# Patient Record
Sex: Female | Born: 1965 | Race: Black or African American | Hispanic: No | Marital: Single | Smoking: Never smoker
Health system: Southern US, Community
[De-identification: ages and names within clinical notes are randomized; demographics above are authoritative.]

## PROBLEM LIST (undated history)

## (undated) DIAGNOSIS — G629 Polyneuropathy, unspecified: Secondary | ICD-10-CM

## (undated) DIAGNOSIS — E119 Type 2 diabetes mellitus without complications: Secondary | ICD-10-CM

## (undated) DIAGNOSIS — J45909 Unspecified asthma, uncomplicated: Secondary | ICD-10-CM

## (undated) DIAGNOSIS — I10 Essential (primary) hypertension: Secondary | ICD-10-CM

## (undated) HISTORY — PX: ABDOMINAL HYSTERECTOMY: SHX81

---

## 2003-10-25 ENCOUNTER — Other Ambulatory Visit: Payer: Self-pay

## 2004-06-20 ENCOUNTER — Emergency Department: Payer: Self-pay | Admitting: Emergency Medicine

## 2005-02-09 ENCOUNTER — Emergency Department: Payer: Self-pay | Admitting: Emergency Medicine

## 2005-12-01 ENCOUNTER — Other Ambulatory Visit: Payer: Self-pay

## 2005-12-01 ENCOUNTER — Inpatient Hospital Stay: Payer: Self-pay | Admitting: Internal Medicine

## 2007-08-05 ENCOUNTER — Emergency Department: Payer: Self-pay | Admitting: Emergency Medicine

## 2012-02-01 ENCOUNTER — Emergency Department: Payer: Self-pay | Admitting: *Deleted

## 2012-10-07 ENCOUNTER — Ambulatory Visit: Payer: Self-pay | Admitting: Family Medicine

## 2012-10-19 ENCOUNTER — Ambulatory Visit: Payer: Self-pay | Admitting: Family Medicine

## 2013-07-16 IMAGING — MG MM CAD SCREENING MAMMO
1 series · 6 of 6 positions shown · non-contrast
Comparison: none

REASON FOR EXAM: SCR MAMMO NO ORDER BASELINE PINK RIBBON
COMMENTS:

[R CC · right · 6 of 6 slices shown]
[im 1/6]
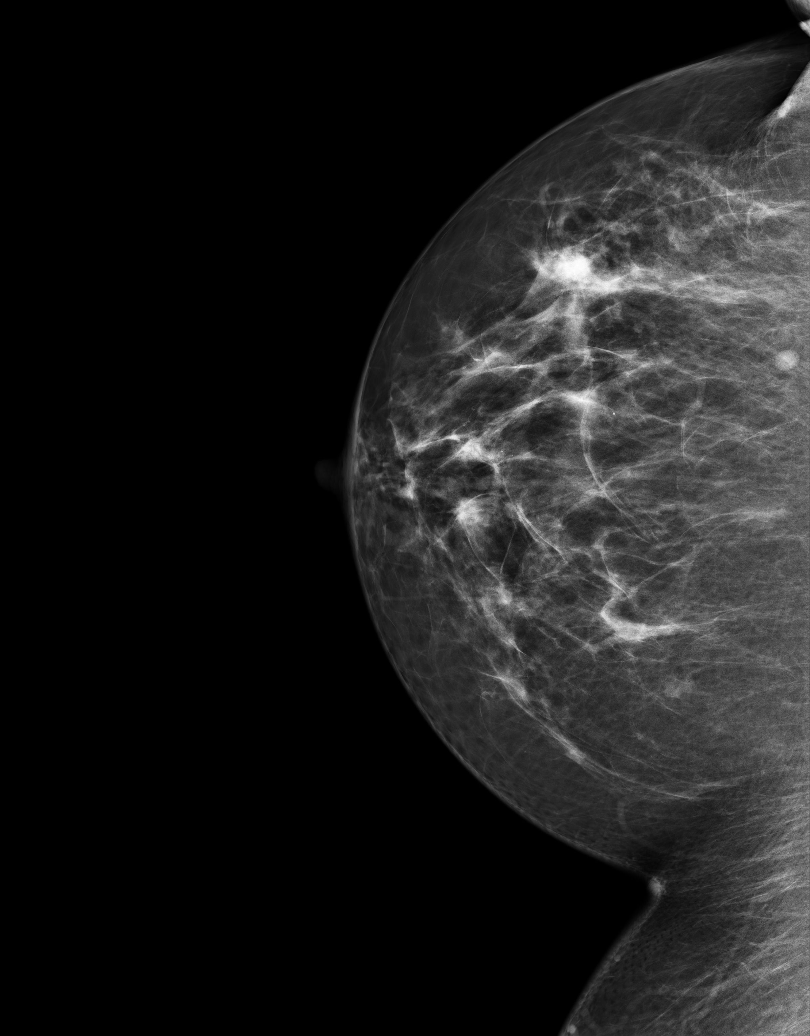
[im 2/6]
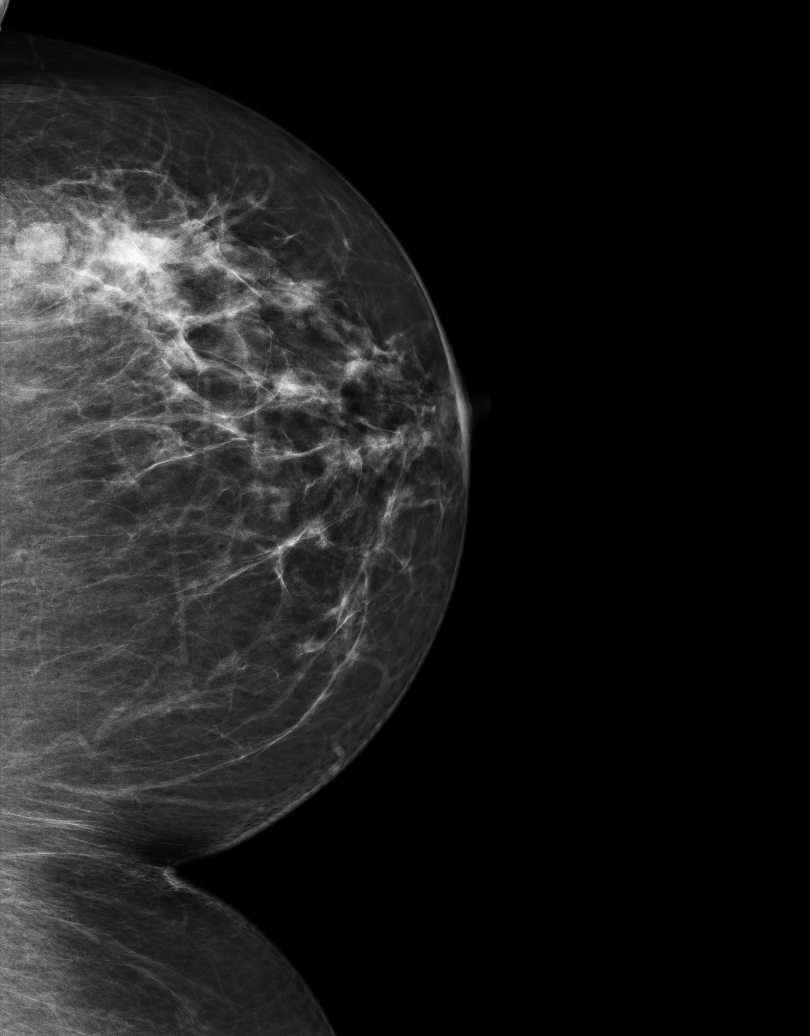
[im 3/6]
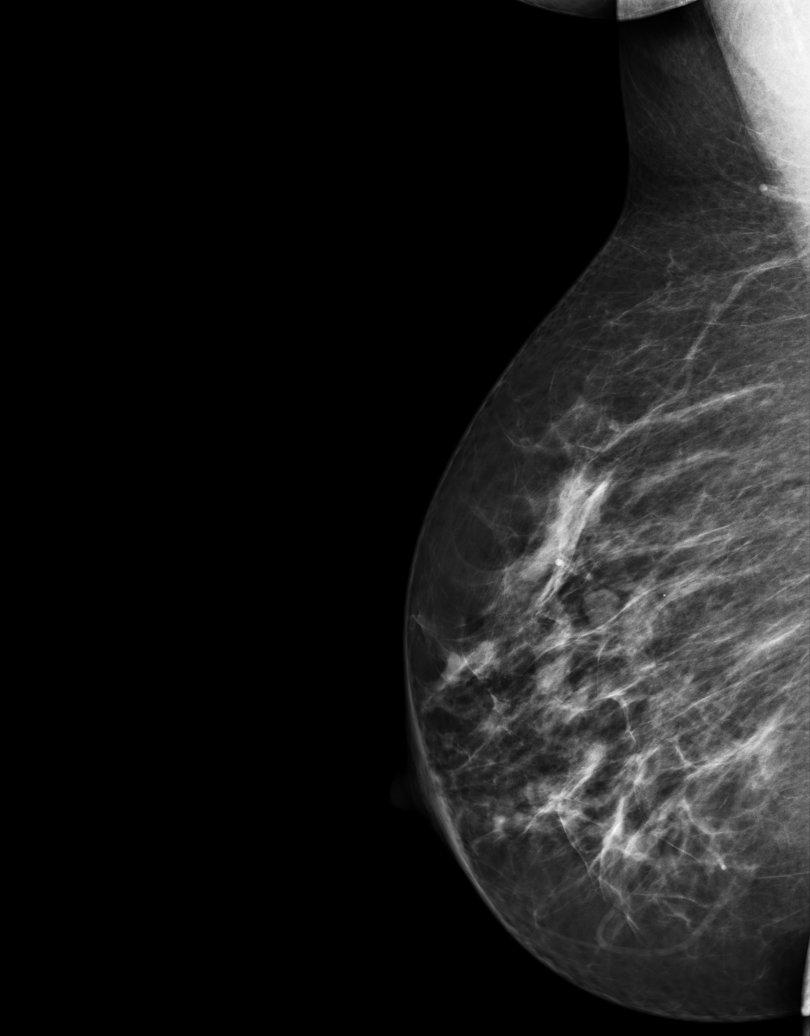
[im 4/6]
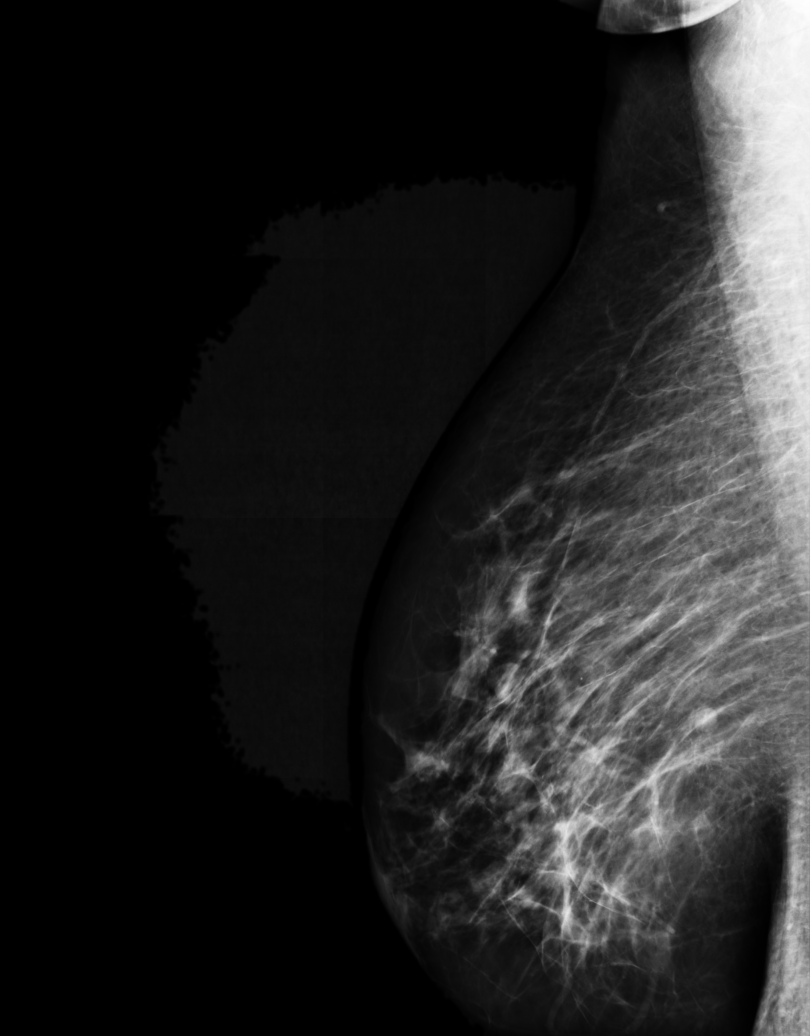
[im 5/6]
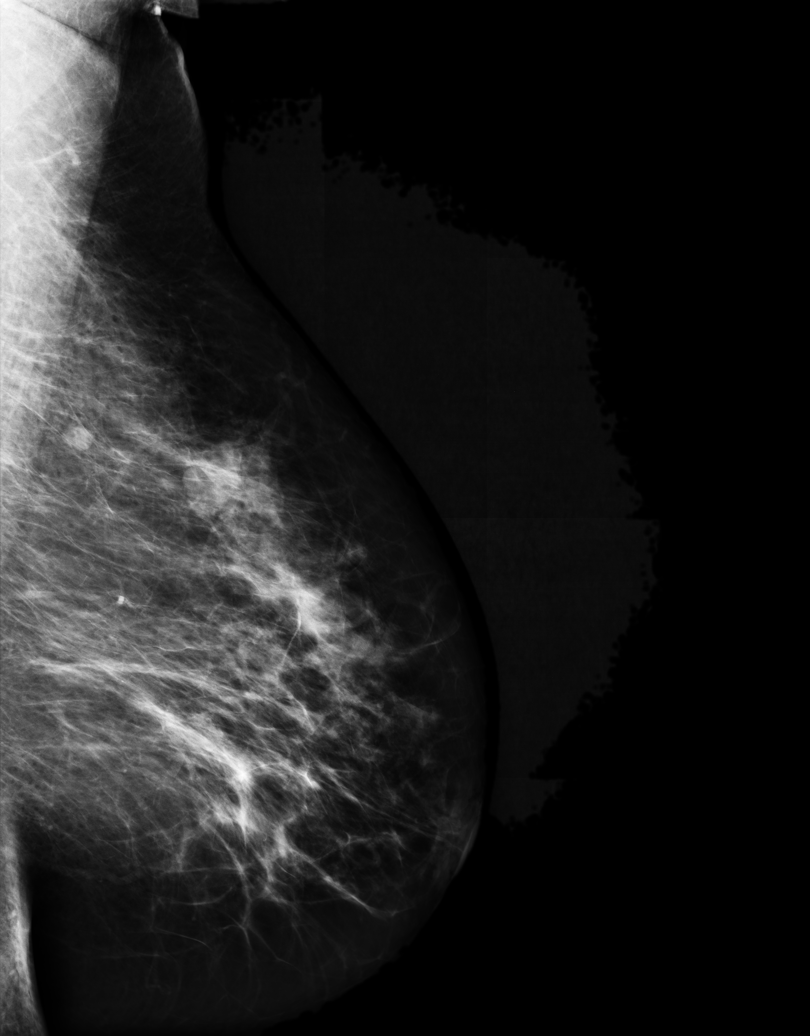
[im 6/6]
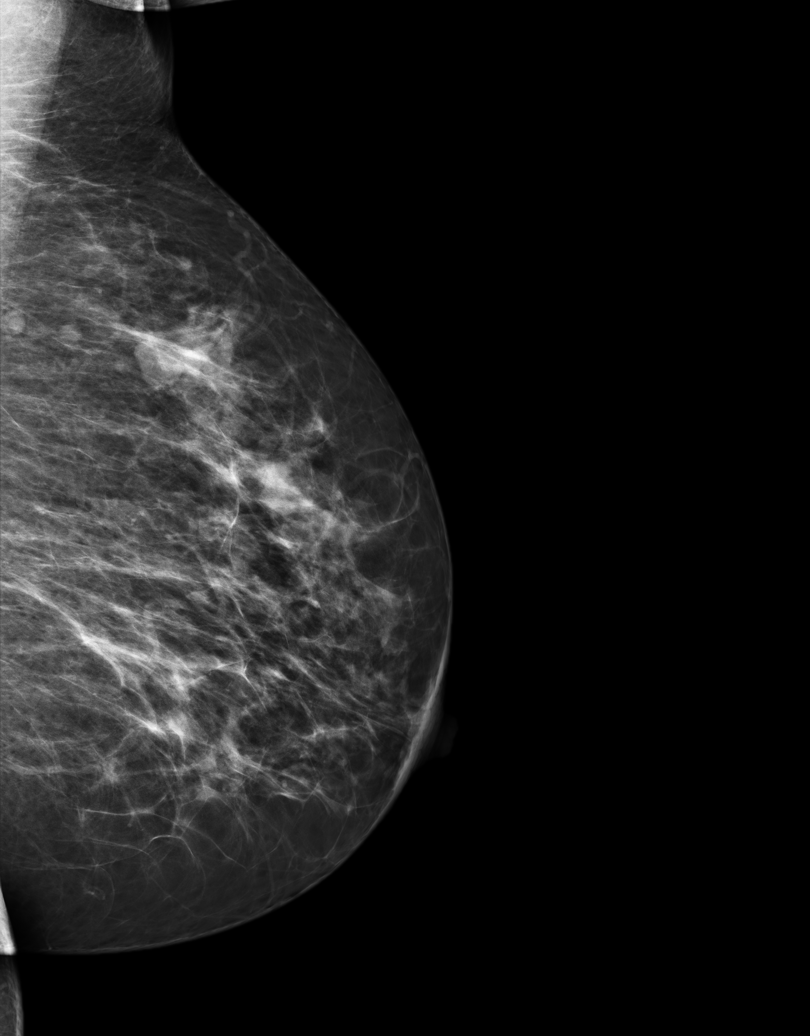

[6 of 6 positions shown; findings below may reference images not displayed]

PROCEDURE:     MAM - MAM DGTL SCRN MAM NO ORDER W/CAD  - October 07, 2012  [DATE]

RESULT:     No prior studies available for comparison. Two nodular densities
are noted in the upper outer portions of both breasts. Patient should return
for bilateral compression spot studies and if need be ultrasound. Benign
calcification. CAD evaluation otherwise nonfocal.
IMPRESSION: Bilateral nodular densities in the upperouter portions of
both breasts. Magnification spot studies if need be ultrasound suggested for
further evaluation. BI-RADS: Catagory 0 - Need Additional Imaging Evaluation

A NEGATIVE MAMMOGRAM REPORT DOES NOT PRECLUDE BIOPSY OR OTHER EVALUATION OF
A CLINICALLY PALPABLE OR OTHERWISE SUSPICIOUS MASS OR LESION. BREAST CANCER
MAY NOT BE DETECTED IN UP TO 10% OF CASES.

BREAST COMPOSITION: The breast composition is SCATTERED FIBROGLANDULAR
TISSUE (glandular tissue is 25-50%)

## 2013-08-23 ENCOUNTER — Ambulatory Visit: Payer: Self-pay | Admitting: Family Medicine

## 2014-03-02 ENCOUNTER — Ambulatory Visit: Payer: Self-pay | Admitting: Family Medicine

## 2014-09-20 DIAGNOSIS — E782 Mixed hyperlipidemia: Secondary | ICD-10-CM | POA: Insufficient documentation

## 2014-09-20 DIAGNOSIS — R079 Chest pain, unspecified: Secondary | ICD-10-CM | POA: Insufficient documentation

## 2014-09-20 DIAGNOSIS — E119 Type 2 diabetes mellitus without complications: Secondary | ICD-10-CM | POA: Insufficient documentation

## 2014-09-20 DIAGNOSIS — E1140 Type 2 diabetes mellitus with diabetic neuropathy, unspecified: Secondary | ICD-10-CM | POA: Insufficient documentation

## 2014-09-20 DIAGNOSIS — J45909 Unspecified asthma, uncomplicated: Secondary | ICD-10-CM | POA: Insufficient documentation

## 2014-09-20 DIAGNOSIS — R0602 Shortness of breath: Secondary | ICD-10-CM | POA: Insufficient documentation

## 2014-09-20 DIAGNOSIS — F411 Generalized anxiety disorder: Secondary | ICD-10-CM | POA: Insufficient documentation

## 2014-09-20 DIAGNOSIS — F418 Other specified anxiety disorders: Secondary | ICD-10-CM | POA: Insufficient documentation

## 2015-04-03 DIAGNOSIS — I10 Essential (primary) hypertension: Secondary | ICD-10-CM | POA: Insufficient documentation

## 2017-10-04 ENCOUNTER — Other Ambulatory Visit: Payer: Self-pay

## 2017-10-04 ENCOUNTER — Encounter: Payer: Self-pay | Admitting: Emergency Medicine

## 2017-10-04 ENCOUNTER — Emergency Department
Admission: EM | Admit: 2017-10-04 | Discharge: 2017-10-05 | Disposition: A | Discharge status: Home / Self Care | Payer: Self-pay | Attending: Emergency Medicine | Admitting: Emergency Medicine

## 2017-10-04 ENCOUNTER — Emergency Department: Payer: Self-pay

## 2017-10-04 DIAGNOSIS — E119 Type 2 diabetes mellitus without complications: Secondary | ICD-10-CM | POA: Insufficient documentation

## 2017-10-04 DIAGNOSIS — K5900 Constipation, unspecified: Secondary | ICD-10-CM | POA: Insufficient documentation

## 2017-10-04 DIAGNOSIS — J45909 Unspecified asthma, uncomplicated: Secondary | ICD-10-CM | POA: Insufficient documentation

## 2017-10-04 DIAGNOSIS — R109 Unspecified abdominal pain: Secondary | ICD-10-CM

## 2017-10-04 DIAGNOSIS — K5730 Diverticulosis of large intestine without perforation or abscess without bleeding: Secondary | ICD-10-CM | POA: Insufficient documentation

## 2017-10-04 DIAGNOSIS — I10 Essential (primary) hypertension: Secondary | ICD-10-CM | POA: Insufficient documentation

## 2017-10-04 HISTORY — DX: Essential (primary) hypertension: I10

## 2017-10-04 HISTORY — DX: Type 2 diabetes mellitus without complications: E11.9

## 2017-10-04 HISTORY — DX: Unspecified asthma, uncomplicated: J45.909

## 2017-10-04 LAB — COMPREHENSIVE METABOLIC PANEL
ALT: 28 U/L (ref 14–54)
AST: 26 U/L (ref 15–41)
Albumin: 4 g/dL (ref 3.5–5.0)
Alkaline Phosphatase: 65 U/L (ref 38–126)
Anion gap: 10 (ref 5–15)
BUN: 13 mg/dL (ref 6–20)
CALCIUM: 9.1 mg/dL (ref 8.9–10.3)
CHLORIDE: 102 mmol/L (ref 101–111)
CO2: 25 mmol/L (ref 22–32)
CREATININE: 0.88 mg/dL (ref 0.44–1.00)
GFR calc Af Amer: >60 mL/min (ref >60)
GFR calc non Af Amer: >60 mL/min (ref >60)
Glucose, Bld: 263 mg/dL — ABNORMAL HIGH (ref 65–99)
Potassium: 4.4 mmol/L (ref 3.5–5.1)
SODIUM: 137 mmol/L (ref 135–145)
Total Bilirubin: 0.5 mg/dL (ref 0.3–1.2)
Total Protein: 7.6 g/dL (ref 6.5–8.1)

## 2017-10-04 LAB — LIPASE, BLOOD: LIPASE: 35 U/L (ref 11–51)

## 2017-10-04 LAB — CBC
HCT: 38.1 % (ref 35.0–47.0)
Hemoglobin: 12.7 g/dL (ref 12.0–16.0)
MCH: 27.8 pg (ref 26.0–34.0)
MCHC: 33.3 g/dL (ref 32.0–36.0)
MCV: 83.4 fL (ref 80.0–100.0)
PLATELETS: 327 10*3/uL (ref 150–440)
RBC: 4.56 MIL/uL (ref 3.80–5.20)
RDW: 14.5 % (ref 11.5–14.5)
WBC: 8.8 10*3/uL (ref 3.6–11.0)

## 2017-10-04 LAB — DIFFERENTIAL
Basophils Absolute: 0.1 10*3/uL (ref 0–0.1)
Basophils Relative: 1 %
EOS ABS: 0.2 10*3/uL (ref 0–0.7)
EOS PCT: 2 %
Lymphocytes Relative: 44 %
Lymphs Abs: 3.9 10*3/uL — ABNORMAL HIGH (ref 1.0–3.6)
MONO ABS: 0.5 10*3/uL (ref 0.2–0.9)
Monocytes Relative: 5 %
Neutro Abs: 4.2 10*3/uL (ref 1.4–6.5)
Neutrophils Relative %: 48 %

## 2017-10-04 LAB — TSH: TSH: 1.093 u[IU]/mL (ref 0.350–4.500)

## 2017-10-04 MED ORDER — IOPAMIDOL (ISOVUE-300) INJECTION 61%
30.0000 mL | Freq: Once | INTRAVENOUS | Status: AC | PRN
  Administered 2017-10-04: 30 mL via ORAL

## 2017-10-04 MED ORDER — IOPAMIDOL (ISOVUE-300) INJECTION 61%
125.0000 mL | Freq: Once | INTRAVENOUS | Status: AC | PRN
  Administered 2017-10-04: 125 mL via INTRAVENOUS

## 2017-10-04 NOTE — ED Triage Notes (Addendum)
Pt reports constipation for 2 months; c/o epigastric pain and lower abd pain; pt unable to state last time had a normal bowel movement; she said only passing small amount of loose stool;

## 2017-10-04 NOTE — ED Notes (Signed)
Pt reports that she has not had a normal BM in 2 months.  Pt states that she has also had issues with her blood sugar being high and that is approximately when all of this began.  Pt is A&Ox4, in NAD.

## 2017-10-04 NOTE — ED Provider Notes (Addendum)
[facility] Emergency Department Provider Note   ____________________________________________   First MD Initiated Contact with Patient 10/04/17 2247     (approximate)  I have reviewed the triage vital signs and the nursing notes.   HISTORY  Chief Complaint Abdominal Pain    HPI Kristen Ibarra is a 52 y.o. female who reports she's been unable have a normal bowel movement for about 2 months she is only passing small amounts of loose stools or or she says "jelly". She says her belly is achy and getting bigger and she is gaining weight. Her sugars have also begun to go up and has stayed up since this started. She does not have any vomiting. She is not rhaving any fever. the abdominal pain is mostly achy but sometimes crampy.   Past Medical History:  Diagnosis Date  . Asthma   . Diabetes mellitus without complication ([place])   . Hypertension     There are no active problems to display for this patient.   History reviewed. No pertinent surgical history.  Prior to Admission medications   Not on File    Allergies Patient has no known allergies.  History reviewed. No pertinent family history.  Social History Social History   Tobacco Use  . Smoking status: Not on file  Substance Use Topics  . Alcohol use: Not on file  . Drug use: Not on file    Review of Systems  Constitutional: No fever/chills Eyes: No visual changes. ENT: No sore throat. Cardiovascular: Denies chest pain. Respiratory: Denies shortness of breath. Gastrointestinal:abdominal pain.  No nausea, no vomiting.  No diarrhea. constipation. Genitourinary: Negative for dysuria. Musculoskeletal: Negative for back pain. Skin: Negative for rash. Neurological: Negative for headaches, focal weakness or _______________   PHYSICAL EXAM:  VITAL SIGNS: ED Triage Vitals  Enc Vitals Group     BP 10/04/17 2216 (!) 199/123     Pulse Rate 10/04/17 2216 85     Resp 10/04/17 2216 19       Temp 10/04/17 2216 99.5 F (37.5 C)     Temp Source 10/04/17 2216 Oral     SpO2 10/04/17 2216 96 %     Weight 10/04/17 2228 270 lb (122.5 kg)     Height 10/04/17 2228 5' 2.5" (1.588 m)     Head Circumference --      Peak Flow --      Pain Score 10/04/17 2217 8     Pain Loc --      Pain Edu? --      Excl. in [place]? --     Constitutional: Alert and oriented. Well appearing and in no acute distress. Eyes: Conjunctivae are normal.  Head: Atraumatic. Nose: No congestion/rhinnorhea. Mouth/Throat: Mucous membranes are moist.  Oropharynx non-erythematous. Neck: No stridor. Cardiovascular: Normal rate, regular rhythm. Grossly normal heart sounds.  Good peripheral circulation. Respiratory: Normal respiratory effort.  No retractions. Lungs CTAB. Gastrointestinal: Soft diffusely mildly tender to palpation. No distention. No abdominal bruits. No CVA tenderness. Musculoskeletal: No lower extremity tenderness nor edema.  No joint effusions. rectal: No masses very little stool in the vault with there is Hemoccult negative Neurologic:  Normal speech and language. No gross focal neurologic deficits are appreciated. . Skin:  Skin is warm, dry and intact. No rash noted. Psychiatric: Mood and affect are normal. Speech and behavior are normal.  ____________________________________________   LABS (all labs ordered are listed, but only abnormal results are displayed)  Labs Reviewed  COMPREHENSIVE METABOLIC PANEL -  Abnormal; Notable for the following components:      Result Value   Glucose, Bld 263 (*)    All other components within normal limits  DIFFERENTIAL - Abnormal; Notable for the following components:   Lymphs Abs 3.9 (*)    All other components within normal limits  LIPASE, BLOOD  CBC  TSH  URINALYSIS, COMPLETE (UACMP) WITH MICROSCOPIC  POC URINE PREG, ED   ____________________________________________  EKG  EKG read and interpreted by me shows normal sinus rhythm rate of 84  normal axis nonspecific ST-T wave changes are present ____________________________________________  RADIOLOGY  ED MD interpretation:    Official radiology report(s): No results found.  ____________________________________________   PROCEDURES  Procedure(s) performed:   Procedures  Critical Care performed:   ____________________________________________   INITIAL IMPRESSION / ASSESSMENT AND PLAN / ED COURSE  patient signed out to Dr. Owens Shark pending results         ____________________________________________   FINAL CLINICAL IMPRESSION(S) / ED DIAGNOSES  Final diagnoses:  Abdominal pain, unspecified abdominal location     ED Discharge Orders    None       Note:  This document was prepared using Dragon voice recognition software and may include unintentional dictation errors.    Nena Polio, MD 10/05/17 Ofilia Neas    Nena Polio, MD 10/20/17 [phone]

## 2017-10-05 ENCOUNTER — Telehealth: Payer: Self-pay | Admitting: Emergency Medicine

## 2017-10-05 LAB — URINALYSIS, COMPLETE (UACMP) WITH MICROSCOPIC
Bilirubin Urine: NEGATIVE
Glucose, UA: >=500 mg/dL — AB
Hgb urine dipstick: NEGATIVE
KETONES UR: NEGATIVE mg/dL
NITRITE: NEGATIVE
PH: 5 (ref 5.0–8.0)
Protein, ur: NEGATIVE mg/dL
SQUAMOUS EPITHELIAL / LPF: NONE SEEN
Specific Gravity, Urine: 1.015 (ref 1.005–1.030)

## 2017-10-05 MED ORDER — DOCUSATE SODIUM 50 MG/5ML PO LIQD
100.0000 mg | Freq: Once | ORAL | Status: AC
  Administered 2017-10-05: 100 mg via ORAL
  Filled 2017-10-05: qty 10

## 2017-10-05 MED ORDER — MAGNESIUM CITRATE PO SOLN
1.0000 | Freq: Once | ORAL | Status: AC
  Administered 2017-10-05: 1 via ORAL
  Filled 2017-10-05: qty 296

## 2017-10-05 MED ORDER — DOCUSATE SODIUM 100 MG PO CAPS
ORAL_CAPSULE | ORAL | Status: AC
  Administered 2017-10-05: 100 mg
  Filled 2017-10-05: qty 1

## 2017-10-05 MED ORDER — MINERAL OIL RE ENEM
1.0000 | ENEMA | Freq: Once | RECTAL | Status: AC
  Administered 2017-10-05: 1 via RECTAL

## 2017-10-05 MED ORDER — POLYETHYLENE GLYCOL 3350 17 G PO PACK: 17.0000 g | PACK | Freq: Every day | ORAL | 0 refills | Status: DC | PRN

## 2017-10-05 NOTE — ED Provider Notes (Signed)
I assumed care of the patient from Dr. Rip Harbour with recommendation to follow-up on CT scan of the abdomen and pelvis.  CT abdomen pelvis revealed diverticulosis and moderate stool burden.  Patient given an enema in the emergency department with good results.  Patient will be prescribed MiraLAX for home.  Patient informed of all clinical findings   Gregor Hams, MD 10/05/17 [phone]

## 2017-10-05 NOTE — Telephone Encounter (Addendum)
ua addendum note reviewed by dr Cinda Quest.  Attempted to call patient to assure she follows up with pcp.  Had budding yeast in urine specimen done yesterday.  Wanted to ask is any urinary symptoms as well.  Her phone is not taking calls at this time.  3/26--phone still says unavailable.  Called pcp office to give result to them.  They said patient is there now.

## 2017-10-21 ENCOUNTER — Ambulatory Visit: Payer: Self-pay | Admitting: Gastroenterology

## 2017-10-21 ENCOUNTER — Encounter: Payer: Self-pay | Admitting: Gastroenterology

## 2017-10-21 VITALS — BP 103/66 | HR 91 | Temp 98.1°F | Ht 62.0 in | Wt 255.8 lb

## 2017-10-21 DIAGNOSIS — R1013 Epigastric pain: Secondary | ICD-10-CM

## 2017-10-21 DIAGNOSIS — K5900 Constipation, unspecified: Secondary | ICD-10-CM

## 2017-10-21 MED ORDER — OMEPRAZOLE 40 MG PO CPDR: 40.0000 mg | DELAYED_RELEASE_CAPSULE | Freq: Every day | ORAL | 3 refills | Status: DC

## 2017-10-21 MED ORDER — PEG 3350-KCL-NA BICARB-NACL 420 G PO SOLR: 4000.0000 mL | Freq: Once | ORAL | 0 refills | Status: AC

## 2017-10-21 NOTE — Progress Notes (Signed)
[name] MD, MRCP(U.K) [address]  [place]  [place], [place] [address]  Main: [phone]  Fax: [phone]   Gastroenterology Consultation  Referring Provider:     Marguerita Merles, MD Primary Care Physician:  Marguerita Merles, MD Primary Gastroenterologist:  Dr. [name]  Reason for Consultation:     Constiption         HPI:   Kristen Ibarra is a 52 y.o. y/o female referred for consultation & management  by Dr. Lennox Grumbles, Connye Burkitt, MD.    She has been referred for constipation. She was seen at [facility] er on 10/11/17 for constipation, UTI. She presented with a 2 month history of abdominal pain and constipation. CT abdomen 10/05/17 showed no acute processed but diverticulosis of the left side of her colon , mild to moderate volume of stool in the colon . Labs 09/2017 CBC,LFT,TSH-normal   Per patient she says that she has had abdominal pain on and off for 2 months  Abdominal pain: Onset: 2 months - every few days , each episode lasts for hours  Site :all different spots , makes it hard to lay down  Radiation: all over the abdomen  Severity :today is not severe 4/10 - in the past can go to 8/10  Nature of pain: sharp in nature , pressure  Aggravating factors: nothing  Relieving factors :lay still  Weight loss: lost 15 lbs - unintentionally  NSAID use: Ibuprofen once a week  PPI use :none  Gall bladder surgery: intact  Frequency of bowel movements: once a week , lately last few months not going , tried miralax didn't work  Change in bowel movements: yes  Relief with bowel movements: yes  Gas/Bloating/Abdominal distension: lots    No diet sodas, chews occasional gum , no teas .   Never had a colonoscopy , never had an EGD. No family history of colon cancer or polyps .   No other GI symptoms  Past Medical History:  Diagnosis Date  . Asthma   . Diabetes mellitus without complication ([place])   . Hypertension     No past surgical history on file.  Prior to Admission  medications   Medication Sig Start Date End Date Taking? Authorizing Provider  senna-docusate (SENOKOT-S) 8.6-50 MG tablet Take by mouth. 10/11/17 10/26/17 Yes [provider]  albuterol (PROVENTIL) (2.5 MG/3ML) 0.083% nebulizer solution Inhale into the lungs.    [provider]  fluticasone (FLONASE) 50 MCG/ACT nasal spray Place into the nose.    [provider]  GABAPENTIN PO Take by mouth.    [provider]  insulin detemir (LEVEMIR) 100 UNIT/ML injection Inject into the skin.    [provider]  Insulin Glargine (LANTUS SOLOSTAR) 100 UNIT/ML Solostar Pen Inject into the skin.    [provider]  Linagliptin (TRADJENTA PO) Take by mouth.    [provider]  Liraglutide (VICTOZA Outlook) Inject into the skin.    [provider]  LISINOPRIL PO Take by mouth.    [provider]  ondansetron (ZOFRAN-ODT) 4 MG disintegrating tablet TAKE 1 TABLET BY MOUTH EVERY 8 HOURS AS NEEDED FOR NAUSEA FOR UP TO 7 DAYS 10/11/17   [provider]  polyethylene glycol (MIRALAX) packet Take 17 g by mouth daily as needed for moderate constipation. 10/05/17   Gregor Hams, MD    No family history on file.   Social History   Tobacco Use  . Smoking status: Not on file  Substance Use Topics  . Alcohol use: Not on file  . Drug use: Not on file    Allergies as of 10/21/2017  . (No Known Allergies)    Review of Systems:    All systems reviewed and negative except where noted in HPI.   Physical Exam:  There were no vitals taken for this visit. No LMP recorded. Patient has had a hysterectomy. Psych:  Alert and cooperative. Normal mood and affect. General:   Alert,  Well-developed, well-nourished, pleasant and cooperative in NAD Head:  Normocephalic and atraumatic. Eyes:  Sclera clear, no icterus.   Conjunctiva pink. Ears:  Normal auditory acuity. Nose:  No deformity, discharge, or lesions. Mouth:  No deformity or  lesions,oropharynx pink & moist. Neck:  Supple; no masses or thyromegaly. Lungs:  Respirations even and unlabored.  Clear throughout to auscultation.   No wheezes, crackles, or rhonchi. No acute distress. Heart:  Regular rate and rhythm; no murmurs, clicks, rubs, or gallops. Abdomen:  Normal bowel sounds.  No bruits.  Soft, non-tender and non-distended without masses, hepatosplenomegaly or hernias noted.  No guarding or rebound tenderness.    Neurologic:  Alert and oriented x3;  grossly normal neurologically. Skin:  Intact without significant lesions or rashes. No jaundice. Lymph Nodes:  No significant cervical adenopathy. Psych:  Alert and cooperative. Normal mood and affect.  Imaging Studies: Ct Abdomen Pelvis W Contrast  Result Date: 10/05/2017 CLINICAL DATA:  52 year old female with epigastric and lower abdominal pain. Constipation for 2 months. EXAM: CT ABDOMEN AND PELVIS WITH CONTRAST TECHNIQUE: Multidetector CT imaging of the abdomen and pelvis was performed using the standard protocol following bolus administration of intravenous contrast. CONTRAST:  1100mL ISOVUE-300 IOPAMIDOL (ISOVUE-300) INJECTION 61% COMPARISON:  Lumbar radiographs 02/01/2012. FINDINGS: Lower chest: Borderline to mild cardiomegaly. Negative lung bases. No pericardial or pleural effusion. Hepatobiliary: Negative liver and gallbladder. Pancreas: Negative. Spleen: Negative. Adrenals/Urinary Tract: Normal adrenal glands. Bilateral renal enhancement and contrast excretion is symmetric and within normal limits. A column of Bertin is noted on the left. There is a somewhat horizontal right renal axis (normal variant). No perinephric stranding. No periureteral stranding. Negative course of both ureters. Gonadal vein and pelvic phleboliths occasionally noted. Mildly distended but otherwise unremarkable urinary bladder. Stomach/Bowel: Unremarkable rectum, mild retained stool. Mild to moderate diverticulosis in the proximal sigmoid colon  and distal descending colon but no active inflammation. Similar mild retained stool throughout those segments. Similar retained stool in the transverse colon. Redundant hepatic flexure. Similar retained stool in the right colon. Oral contrast has just reached the ileocecal valve. Normal appendix (coronal image 65). Negative terminal ileum. No dilated small bowel. Negative stomach and duodenum. No mesenteric inflammation. No abdominal free air or free fluid. Vascular/Lymphatic: Major arterial structures in the abdomen and pelvis are patent. Portal venous system is patent. No lymphadenopathy. Reproductive: Surgically absent uterus.  Negative ovaries. Other: No pelvic free fluid. Musculoskeletal: Lower lumbar disc degeneration. No acute osseous abnormality identified. IMPRESSION: 1. No acute or inflammatory process identified in the abdomen or pelvis. 2. Diverticulosis of the descending and sigmoid colon without active inflammation. Normal appendix. Mild to moderate volume of retained stool throughout the colon. Electronically Signed   By: Genevie Ann M.D.   On: 10/05/2017 00:10    Assessment and Plan:   Kristen Ibarra is a 52 y.o. y/o female has been referred for abdominal pain , change in bowel movements. Severe constipation failed miralax and senna  Plan  1. Trial of PPI  2. EGD+colonoscopy  3. High fiber diet patient information provided  4. Trial of linzess 290 mcg 2 weeks samples provided  I have discussed alternative options, risks & benefits,  which include, but are not limited to, bleeding, infection, perforation,respiratory complication & drug reaction.  The patient agrees with this plan & written consent will be obtained.     Follow up in 6 weeks   Dr [name] MD,MRCP(U.K)

## 2017-10-21 NOTE — Patient Instructions (Addendum)
1. Pick-up Omeprazole & Gavilyte prep solution from pharmacy. 2. Given 2 weeks of Linzess 238mcg. Take 1 tablet per day, 30 minutes before breakfast. 3. Start introducing foods from high fiber diet chart.

## 2017-10-21 NOTE — Addendum Note (Signed)
Addended by: Peggye Ley on: 10/21/2017 04:40 PM   Modules accepted: Orders, SmartSet

## 2017-10-30 ENCOUNTER — Encounter: Payer: Self-pay | Admitting: Anesthesiology

## 2017-10-30 ENCOUNTER — Ambulatory Visit: Payer: Self-pay | Admitting: Certified Registered"

## 2017-10-30 ENCOUNTER — Encounter
Admission: RE | Disposition: A | Discharge status: Home / Self Care | Payer: Self-pay | Source: Ambulatory Visit | Attending: Gastroenterology

## 2017-10-30 ENCOUNTER — Other Ambulatory Visit: Payer: Self-pay

## 2017-10-30 ENCOUNTER — Ambulatory Visit
Admission: RE | Admit: 2017-10-30 | Discharge: 2017-10-30 | Disposition: A | Discharge status: Home / Self Care | Payer: Self-pay | Source: Ambulatory Visit | Attending: Gastroenterology | Admitting: Gastroenterology

## 2017-10-30 DIAGNOSIS — K2950 Unspecified chronic gastritis without bleeding: Secondary | ICD-10-CM | POA: Insufficient documentation

## 2017-10-30 DIAGNOSIS — R1013 Epigastric pain: Secondary | ICD-10-CM

## 2017-10-30 DIAGNOSIS — K2970 Gastritis, unspecified, without bleeding: Secondary | ICD-10-CM

## 2017-10-30 DIAGNOSIS — I10 Essential (primary) hypertension: Secondary | ICD-10-CM | POA: Insufficient documentation

## 2017-10-30 DIAGNOSIS — Z6841 Body Mass Index (BMI) 40.0 and over, adult: Secondary | ICD-10-CM | POA: Insufficient documentation

## 2017-10-30 DIAGNOSIS — K5900 Constipation, unspecified: Secondary | ICD-10-CM | POA: Insufficient documentation

## 2017-10-30 DIAGNOSIS — J45909 Unspecified asthma, uncomplicated: Secondary | ICD-10-CM | POA: Insufficient documentation

## 2017-10-30 DIAGNOSIS — E119 Type 2 diabetes mellitus without complications: Secondary | ICD-10-CM | POA: Insufficient documentation

## 2017-10-30 DIAGNOSIS — Z79899 Other long term (current) drug therapy: Secondary | ICD-10-CM | POA: Insufficient documentation

## 2017-10-30 DIAGNOSIS — Z794 Long term (current) use of insulin: Secondary | ICD-10-CM | POA: Insufficient documentation

## 2017-10-30 DIAGNOSIS — D120 Benign neoplasm of cecum: Secondary | ICD-10-CM | POA: Insufficient documentation

## 2017-10-30 DIAGNOSIS — B9681 Helicobacter pylori [H. pylori] as the cause of diseases classified elsewhere: Secondary | ICD-10-CM | POA: Insufficient documentation

## 2017-10-30 HISTORY — PX: ESOPHAGOGASTRODUODENOSCOPY (EGD) WITH PROPOFOL: SHX5813

## 2017-10-30 HISTORY — PX: COLONOSCOPY WITH PROPOFOL: SHX5780

## 2017-10-30 LAB — GLUCOSE, CAPILLARY: Glucose-Capillary: 119 mg/dL — ABNORMAL HIGH (ref 65–99)

## 2017-10-30 SURGERY — ESOPHAGOGASTRODUODENOSCOPY (EGD) WITH PROPOFOL
Anesthesia: General

## 2017-10-30 MED ORDER — SODIUM CHLORIDE 0.9 % IV SOLN
INTRAVENOUS | Status: DC
  Administered 2017-10-30: 08:00:00 via INTRAVENOUS

## 2017-10-30 MED ORDER — PROPOFOL 10 MG/ML IV BOLUS
INTRAVENOUS | Status: DC | PRN
  Administered 2017-10-30: 70 mg via INTRAVENOUS

## 2017-10-30 MED ORDER — LIDOCAINE HCL (CARDIAC) PF 100 MG/5ML IV SOSY
PREFILLED_SYRINGE | INTRAVENOUS | Status: DC | PRN
  Administered 2017-10-30: 50 mg via INTRAVENOUS

## 2017-10-30 MED ORDER — MIDAZOLAM HCL 2 MG/2ML IJ SOLN
INTRAMUSCULAR | Status: DC | PRN
  Administered 2017-10-30: 2 mg via INTRAVENOUS

## 2017-10-30 MED ORDER — MIDAZOLAM HCL 2 MG/2ML IJ SOLN
INTRAMUSCULAR | Status: AC
  Filled 2017-10-30: qty 2

## 2017-10-30 MED ORDER — PROPOFOL 500 MG/50ML IV EMUL
INTRAVENOUS | Status: AC
Start: 2017-10-30 — End: ?
  Filled 2017-10-30: qty 50

## 2017-10-30 MED ORDER — LIDOCAINE HCL (PF) 2 % IJ SOLN
INTRAMUSCULAR | Status: AC
  Filled 2017-10-30: qty 10

## 2017-10-30 MED ORDER — PROPOFOL 500 MG/50ML IV EMUL
INTRAVENOUS | Status: DC | PRN
  Administered 2017-10-30: 100 ug/kg/min via INTRAVENOUS

## 2017-10-30 NOTE — Anesthesia Preprocedure Evaluation (Signed)
Anesthesia Evaluation  Patient identified by MRN, date of birth, ID band Patient awake    Reviewed: Allergy & Precautions, NPO status , Patient's Chart, lab work & pertinent test results, reviewed documented beta blocker date and time   Airway Mallampati: III  TM Distance: >3 FB     Dental  (+) Chipped   Pulmonary           Cardiovascular hypertension,      Neuro/Psych    GI/Hepatic   Endo/Other  diabetes, Type 2Morbid obesity  Renal/GU      Musculoskeletal   Abdominal   Peds  Hematology   Anesthesia Other Findings   Reproductive/Obstetrics                             Anesthesia Physical Anesthesia Plan  ASA: III  Anesthesia Plan: General   Post-op Pain Management:    Induction: Intravenous  PONV Risk Score and Plan:   Airway Management Planned:   Additional Equipment:   Intra-op Plan:   Post-operative Plan:   Informed Consent: I have reviewed the patients History and Physical, chart, labs and discussed the procedure including the risks, benefits and alternatives for the proposed anesthesia with the patient or authorized representative who has indicated his/her understanding and acceptance.     Plan Discussed with: CRNA  Anesthesia Plan Comments:         Anesthesia Quick Evaluation

## 2017-10-30 NOTE — Anesthesia Postprocedure Evaluation (Signed)
Anesthesia Post Note  Patient: KAYLENA PACIFICO  Procedure(s) Performed: ESOPHAGOGASTRODUODENOSCOPY (EGD) WITH PROPOFOL (N/A ) COLONOSCOPY WITH PROPOFOL (N/A )  Patient location during evaluation: Endoscopy Anesthesia Type: General Level of consciousness: awake and alert Pain management: pain level controlled Vital Signs Assessment: post-procedure vital signs reviewed and stable Respiratory status: spontaneous breathing, nonlabored ventilation, respiratory function stable and patient connected to nasal cannula oxygen Cardiovascular status: blood pressure returned to baseline and stable Postop Assessment: no apparent nausea or vomiting Anesthetic complications: no     Last Vitals:  Vitals:   10/30/17 0919 10/30/17 0929  BP: 118/73 (!) 115/57  Pulse: 82 81  Resp: (!) 21 18  Temp:    SpO2: 100% 99%    Last Pain:  Vitals:   10/30/17 0909  TempSrc:   PainSc: 0-No pain                 Kaytlan Behrman S

## 2017-10-30 NOTE — Op Note (Signed)
[facility] Gastroenterology Patient Name: Kristen Ibarra Procedure Date: 10/30/2017 8:18 AM MRN: [id number] Account #: [id number] Date of Birth: 07/23/1965 Admit Type: Outpatient Age: 52 Room: [facility] ENDO ROOM 4 Gender: Female Note Status: Finalized Procedure:            Colonoscopy Indications:          Constipation Providers:            [name] MD, MD Referring MD:         Marguerita Merles, MD (Referring MD) Medicines:            Monitored Anesthesia Care Complications:        No immediate complications. Procedure:            Pre-Anesthesia Assessment:                       - Prior to the procedure, a History and Physical was                        performed, and patient medications, allergies and                        sensitivities were reviewed. The patient's tolerance of                        previous anesthesia was reviewed.                       - The risks and benefits of the procedure and the                        sedation options and risks were discussed with the                        patient. All questions were answered and informed                        consent was obtained.                       - After reviewing the risks and benefits, the patient                        was deemed in satisfactory condition to undergo the                        procedure.                       - ASA Grade Assessment: II - A patient with mild                        systemic disease.                       After obtaining informed consent, the colonoscope was                        passed under direct vision. Throughout the procedure,                        the patient's blood pressure,  pulse, and oxygen                        saturations were monitored continuously. The                        Colonoscope was introduced through the anus and                        advanced to the the cecum, identified by the                        appendiceal orifice, IC valve and  transillumination.                        The colonoscopy was performed with ease. The patient                        tolerated the procedure well. The quality of the bowel                        preparation was poor. Findings:      The perianal and digital rectal examinations were normal.      A 3 mm polyp was found in the cecum. The polyp was sessile. The polyp       was removed with a cold biopsy forceps. Resection and retrieval were       complete.      A 5 mm polyp was found in the cecum. The polyp was sessile. The polyp       was removed with a cold snare. Resection and retrieval were complete.      The exam was otherwise without abnormality on direct and retroflexion       views. Impression:           - Preparation of the colon was poor.                       - One 3 mm polyp in the cecum, removed with a cold                        biopsy forceps. Resected and retrieved.                       - One 5 mm polyp in the cecum, removed with a cold                        snare. Resected and retrieved.                       - The examination was otherwise normal on direct and                        retroflexion views. Recommendation:       - Discharge patient to home (with escort).                       - Resume previous diet.                       - Continue present medications.                       -  Await pathology results.                       - Repeat colonoscopy in 6 months because the bowel                        preparation was suboptimal.                       - Return to GI office as previously scheduled. 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 All rights reserved. The codes documented in this report are preliminary and upon coder review may  be revised to meet current compliance requirements. [name], MD [name] MD, MD 10/30/2017 8:48:55 AM This report has been signed electronically. Number of Addenda: 0 Note Initiated On: 10/30/2017 8:18 AM Scope Withdrawal Time: 0 hours 13 minutes 43 seconds  Total Procedure Duration: 0 hours 16 minutes 1 second       [facility]

## 2017-10-30 NOTE — Anesthesia Post-op Follow-up Note (Signed)
Anesthesia QCDR form completed.        

## 2017-10-30 NOTE — H&P (Signed)
Kristen Bellows, MD [address] [place], [place], [place], [address] [address], [place], [place], [place], [address] Phone: [phone]  Fax: [phone]  Primary Care Physician:  Marguerita Merles, MD   Pre-Procedure History & Physical: HPI:  Kristen Ibarra is a 52 y.o. female is here for an endoscopy and colonoscopy    Past Medical History:  Diagnosis Date  . Asthma   . Diabetes mellitus without complication ([place])   . Hypertension     Past Surgical History:  Procedure Laterality Date  . ABDOMINAL HYSTERECTOMY      Prior to Admission medications   Medication Sig Start Date End Date Taking? Authorizing Provider  albuterol (PROVENTIL) (2.5 MG/3ML) 0.083% nebulizer solution Inhale into the lungs.   Yes [provider]  fluticasone (FLONASE) 50 MCG/ACT nasal spray Place into the nose.   Yes [provider]  GABAPENTIN PO Take by mouth.   Yes [provider]  insulin detemir (LEVEMIR) 100 UNIT/ML injection Inject into the skin.   Yes [provider]  Linagliptin (TRADJENTA PO) Take by mouth.   Yes [provider]  Liraglutide (VICTOZA [place]) Inject into the skin.   Yes [provider]  LISINOPRIL PO Take by mouth.   Yes [provider]  ondansetron (ZOFRAN-ODT) 4 MG disintegrating tablet TAKE 1 TABLET BY MOUTH EVERY 8 HOURS AS NEEDED FOR NAUSEA FOR UP TO 7 DAYS 10/11/17  Yes [provider]  Insulin Glargine (LANTUS SOLOSTAR) 100 UNIT/ML Solostar Pen Inject into the skin.    [provider]  omeprazole (PRILOSEC) 40 MG capsule Take 1 capsule (40 mg total) by mouth daily. Patient not taking: Reported on 10/30/2017 10/21/17   Kristen Bellows, MD  polyethylene glycol [facility]) packet Take 17 g by mouth daily as needed for moderate constipation. Patient not taking: Reported on 10/30/2017 10/05/17   Gregor Hams, MD    Allergies as of 10/22/2017  . (No Known Allergies)    History reviewed. No  pertinent family history.  Social History   Socioeconomic History  . Marital status: Single    Spouse name: Not on file  . Number of children: Not on file  . Years of education: Not on file  . Highest education level: Not on file  Occupational History  . Not on file  Social Needs  . Financial resource strain: Not on file  . Food insecurity:    Worry: Not on file    Inability: Not on file  . Transportation needs:    Medical: Not on file    Non-medical: Not on file  Tobacco Use  . Smoking status: Never Smoker  . Smokeless tobacco: Never Used  Substance and Sexual Activity  . Alcohol use: Never    Frequency: Never  . Drug use: Never  . Sexual activity: Yes  Lifestyle  . Physical activity:    Days per week: Not on file    Minutes per session: Not on file  . Stress: Not on file  Relationships  . Social connections:    Talks on phone: Not on file    Gets together: Not on file    Attends religious service: Not on file    Active member of club or organization: Not on file    Attends meetings of clubs or organizations: Not on file    Relationship status: Not on file  . Intimate partner violence:    Fear of current or ex partner: Not on file  Emotionally abused: Not on file    Physically abused: Not on file    Forced sexual activity: Not on file  Other Topics Concern  . Not on file  Social History Narrative  . Not on file    Review of Systems: See HPI, otherwise negative ROS  Physical Exam: BP 133/83   Pulse 87   Temp 98.3 F (36.8 C) (Tympanic)   Resp 18   Ht 5\' 2"  (1.575 m)   Wt 258 lb (117 kg)   SpO2 99%   BMI 47.19 kg/m  General:   Alert,  pleasant and cooperative in NAD Head:  Normocephalic and atraumatic. Neck:  Supple; no masses or thyromegaly. Lungs:  Clear throughout to auscultation, normal respiratory effort.    Heart:  +S1, +S2, Regular rate and rhythm, No edema. Abdomen:  Soft, nontender and nondistended. Normal bowel sounds, without  guarding, and without rebound.   Neurologic:  Alert and  oriented x4;  grossly normal neurologically.  Impression/Plan: Kristen Ibarra is here for an endoscopy and colonoscopy  to be performed for  evaluation of abdominal pain and constipation    Risks, benefits, limitations, and alternatives regarding endoscopy have been reviewed with the patient.  Questions have been answered.  All parties agreeable.   Kristen Bellows, MD  10/30/2017, 8:12 AM

## 2017-10-30 NOTE — Anesthesia Procedure Notes (Signed)
Performed by: Shonita Rinck, CRNA Pre-anesthesia Checklist: Patient identified, Emergency Drugs available, Suction available, Patient being monitored and Timeout performed Patient Re-evaluated:Patient Re-evaluated prior to induction Oxygen Delivery Method: Nasal cannula Induction Type: IV induction       

## 2017-10-30 NOTE — Transfer of Care (Signed)
Immediate Anesthesia Transfer of Care Note  Patient: Kristen Ibarra  Procedure(s) Performed: ESOPHAGOGASTRODUODENOSCOPY (EGD) WITH PROPOFOL (N/A ) COLONOSCOPY WITH PROPOFOL (N/A )  Patient Location: PACU  Anesthesia Type:General  Level of Consciousness: sedated  Airway & Oxygen Therapy: Patient Spontanous Breathing and Patient connected to face mask oxygen  Post-op Assessment: Report given to RN and Post -op Vital signs reviewed and stable  Post vital signs: Reviewed and stable  Last Vitals:  Vitals Value Taken Time  BP 115/74 10/30/2017  8:51 AM  Temp 36 C 10/30/2017  8:49 AM  Pulse 85 10/30/2017  8:53 AM  Resp 18 10/30/2017  8:53 AM  SpO2 100 % 10/30/2017  8:53 AM  Vitals shown include unvalidated device data.  Last Pain:  Vitals:   10/30/17 0849  TempSrc: Tympanic  PainSc:          Complications: No apparent anesthesia complications

## 2017-10-30 NOTE — Op Note (Signed)
[facility] Gastroenterology Patient Name: Kristen Ibarra Procedure Date: 10/30/2017 8:19 AM MRN: [id number] Account #: [id number] Date of Birth: Apr 23, 1966 Admit Type: Outpatient Age: 52 Room: [facility] ENDO ROOM 4 Gender: Female Note Status: Finalized Procedure:            Upper GI endoscopy Indications:          Abdominal pain Providers:            [name] MD, MD Referring MD:         Marguerita Merles, MD (Referring MD) Medicines:            Monitored Anesthesia Care Complications:        No immediate complications. Procedure:            Pre-Anesthesia Assessment:                       - Prior to the procedure, a History and Physical was                        performed, and patient medications, allergies and                        sensitivities were reviewed. The patient's tolerance of                        previous anesthesia was reviewed.                       - The risks and benefits of the procedure and the                        sedation options and risks were discussed with the                        patient. All questions were answered and informed                        consent was obtained.                       - ASA Grade Assessment: II - A patient with mild                        systemic disease.                       After obtaining informed consent, the endoscope was                        passed under direct vision. Throughout the procedure,                        the patient's blood pressure, pulse, and oxygen                        saturations were monitored continuously. The Endoscope                        was introduced through the mouth, and advanced to the  third part of duodenum. The upper GI endoscopy was                        accomplished with ease. The patient tolerated the                        procedure well. Findings:      The examined duodenum was normal.      The esophagus was normal.      Segmental  moderate inflammation characterized by congestion (edema),       erosions and erythema was found in the gastric antrum. Biopsies were       taken with a cold forceps for histology.      The cardia and gastric fundus were normal on retroflexion. Impression:           - Normal examined duodenum.                       - Normal esophagus.                       - Gastritis. Biopsied. Recommendation:       - Await pathology results.                       - Perform a colonoscopy today. Procedure Code(s):    --- Professional ---                       [phone], Esophagogastroduodenoscopy, flexible, transoral;                        with biopsy, single or multiple Diagnosis Code(s):    --- Professional ---                       K29.70, Gastritis, unspecified, without bleeding                       R10.9, Unspecified abdominal pain CPT copyright 2017 American Medical Association. All rights reserved. The codes documented in this report are preliminary and upon coder review may  be revised to meet current compliance requirements. [name], MD [name] MD, MD 10/30/2017 8:28:19 AM This report has been signed electronically. Number of Addenda: 0 Note Initiated On: 10/30/2017 8:19 AM      [facility]

## 2017-11-02 ENCOUNTER — Encounter: Payer: Self-pay | Admitting: Gastroenterology

## 2017-11-02 LAB — SURGICAL PATHOLOGY

## 2017-11-11 ENCOUNTER — Telehealth: Payer: Self-pay

## 2017-11-11 DIAGNOSIS — R1013 Epigastric pain: Secondary | ICD-10-CM

## 2017-11-11 DIAGNOSIS — K2970 Gastritis, unspecified, without bleeding: Principal | ICD-10-CM

## 2017-11-11 DIAGNOSIS — B9681 Helicobacter pylori [H. pylori] as the cause of diseases classified elsewhere: Secondary | ICD-10-CM

## 2017-11-11 MED ORDER — OMEPRAZOLE 40 MG PO CPDR: 40.0000 mg | DELAYED_RELEASE_CAPSULE | Freq: Every day | ORAL | 3 refills | Status: AC

## 2017-11-11 MED ORDER — CLARITHROMYCIN 500 MG PO TABS: 500.0000 mg | ORAL_TABLET | Freq: Two times a day (BID) | ORAL | 0 refills | Status: AC

## 2017-11-11 MED ORDER — AMOXICILLIN 500 MG PO CAPS: 1000.0000 mg | ORAL_CAPSULE | Freq: Two times a day (BID) | ORAL | 0 refills | Status: AC

## 2017-11-11 NOTE — Telephone Encounter (Signed)
Advised patient of results per Dr. Vicente Males.    - H pylori positive gastritis - needs omeprazole 40mg  once daily , amoxicillin 1 gram BID and clarithromycin 500 mg BID for 14 days, recheck H pylori stool antigen in 6 weeks . Check for penicillin allergy.  - inform polyp was an adenoma- repeat colonoscopy 4-6 months due to poor prep  Spoke to Ms. Gorniak: no penicillin allergy that she is aware of. Picking up meds tonight.   Requesting reminder for 6 week lab re-check.

## 2017-12-15 ENCOUNTER — Ambulatory Visit: Payer: Self-pay | Admitting: Gastroenterology

## 2018-06-27 ENCOUNTER — Other Ambulatory Visit: Payer: Self-pay

## 2018-06-27 ENCOUNTER — Emergency Department
Admission: EM | Admit: 2018-06-27 | Discharge: 2018-06-27 | Disposition: A | Discharge status: Home / Self Care | Payer: Self-pay | Attending: Emergency Medicine | Admitting: Emergency Medicine

## 2018-06-27 DIAGNOSIS — Z79899 Other long term (current) drug therapy: Secondary | ICD-10-CM | POA: Insufficient documentation

## 2018-06-27 DIAGNOSIS — E1142 Type 2 diabetes mellitus with diabetic polyneuropathy: Secondary | ICD-10-CM

## 2018-06-27 DIAGNOSIS — J45909 Unspecified asthma, uncomplicated: Secondary | ICD-10-CM | POA: Insufficient documentation

## 2018-06-27 DIAGNOSIS — Z9119 Patient's noncompliance with other medical treatment and regimen: Secondary | ICD-10-CM

## 2018-06-27 DIAGNOSIS — E119 Type 2 diabetes mellitus without complications: Secondary | ICD-10-CM | POA: Insufficient documentation

## 2018-06-27 DIAGNOSIS — Z794 Long term (current) use of insulin: Secondary | ICD-10-CM | POA: Insufficient documentation

## 2018-06-27 DIAGNOSIS — Z91199 Patient's noncompliance with other medical treatment and regimen due to unspecified reason: Secondary | ICD-10-CM

## 2018-06-27 DIAGNOSIS — E1140 Type 2 diabetes mellitus with diabetic neuropathy, unspecified: Secondary | ICD-10-CM | POA: Insufficient documentation

## 2018-06-27 DIAGNOSIS — I10 Essential (primary) hypertension: Secondary | ICD-10-CM | POA: Insufficient documentation

## 2018-06-27 HISTORY — DX: Polyneuropathy, unspecified: G62.9

## 2018-06-27 LAB — CBC WITH DIFFERENTIAL/PLATELET
ABS IMMATURE GRANULOCYTES: 0.03 10*3/uL (ref 0.00–0.07)
Basophils Absolute: 0 10*3/uL (ref 0.0–0.1)
Basophils Relative: 1 %
Eosinophils Absolute: 0.2 10*3/uL (ref 0.0–0.5)
Eosinophils Relative: 2 %
HCT: 38.7 % (ref 36.0–46.0)
HEMOGLOBIN: 12.7 g/dL (ref 12.0–15.0)
Immature Granulocytes: 0 %
LYMPHS PCT: 36 %
Lymphs Abs: 2.7 10*3/uL (ref 0.7–4.0)
MCH: 27.4 pg (ref 26.0–34.0)
MCHC: 32.8 g/dL (ref 30.0–36.0)
MCV: 83.4 fL (ref 80.0–100.0)
MONOS PCT: 6 %
Monocytes Absolute: 0.4 10*3/uL (ref 0.1–1.0)
Neutro Abs: 4.2 10*3/uL (ref 1.7–7.7)
Neutrophils Relative %: 55 %
Platelets: 344 10*3/uL (ref 150–400)
RBC: 4.64 MIL/uL (ref 3.87–5.11)
RDW: 13.6 % (ref 11.5–15.5)
WBC: 7.6 10*3/uL (ref 4.0–10.5)
nRBC: 0 % (ref 0.0–0.2)

## 2018-06-27 LAB — BASIC METABOLIC PANEL
Anion gap: 11 (ref 5–15)
BUN: 22 mg/dL — ABNORMAL HIGH (ref 6–20)
CHLORIDE: 98 mmol/L (ref 98–111)
CO2: 24 mmol/L (ref 22–32)
Calcium: 8.5 mg/dL — ABNORMAL LOW (ref 8.9–10.3)
Creatinine, Ser: 1.01 mg/dL — ABNORMAL HIGH (ref 0.44–1.00)
GFR calc Af Amer: >60 mL/min (ref >60)
GFR calc non Af Amer: >60 mL/min (ref >60)
GLUCOSE: 383 mg/dL — AB (ref 70–99)
POTASSIUM: 3.5 mmol/L (ref 3.5–5.1)
Sodium: 133 mmol/L — ABNORMAL LOW (ref 135–145)

## 2018-06-27 MED ORDER — SODIUM CHLORIDE 0.9 % IV BOLUS
500.0000 mL | Freq: Once | INTRAVENOUS | Status: AC
  Administered 2018-06-27: 500 mL via INTRAVENOUS

## 2018-06-27 MED ORDER — LORAZEPAM 1 MG PO TABS
1.0000 mg | ORAL_TABLET | Freq: Once | ORAL | Status: AC
  Administered 2018-06-27: 1 mg via ORAL
  Filled 2018-06-27: qty 1

## 2018-06-27 MED ORDER — ACETAMINOPHEN 325 MG PO TABS
650.0000 mg | ORAL_TABLET | Freq: Once | ORAL | Status: AC
  Administered 2018-06-27: 650 mg via ORAL
  Filled 2018-06-27: qty 2

## 2018-06-27 NOTE — ED Provider Notes (Addendum)
[facility] Emergency Department Provider Note  ____________________________________________   I have reviewed the triage vital signs and the nursing notes. Where available I have reviewed prior notes and, if possible and indicated, outside hospital notes.    HISTORY  Chief Complaint Peripheral Neuropathy    HPI Kristen Ibarra is a 52 y.o. female  who presents today complaining of her peripheral neuropathy being "real bad today".  No numbness no weakness, just shooting neuropathy pains which she is always had.  She states is worse when she gets anxious she is very anxious about the holidays.  She also states that she has not been particularly compliant with her insulin recently.  She thinks that her pain is going to push up her sugar levels and she is for that reason she states not interested in taking a lot of insulin.  At the same time she is she states somewhat indiscrete with her diet.  Nonetheless she has no focal numbness or weakness he has bilateral chronic "neuropathy" burning pain to both feet with no trauma, and she also complains of being very anxious with no SI or HI.      Past Medical History:  Diagnosis Date  . Asthma   . Diabetes mellitus without complication ([place])   . Hypertension   . Neuropathy     Patient Active Problem List   Diagnosis Date Noted  . Benign essential hypertension 04/03/2015  . Asthma 09/20/2014  . Chest pain, unspecified 09/20/2014  . Depression with anxiety 09/20/2014  . Diabetes ([place]) 09/20/2014  . Diabetic neuropathy ([place]) 09/20/2014  . Generalized anxiety disorder 09/20/2014  . Mixed hyperlipidemia 09/20/2014  . SOB (shortness of breath) 09/20/2014    Past Surgical History:  Procedure Laterality Date  . ABDOMINAL HYSTERECTOMY    . COLONOSCOPY WITH PROPOFOL N/A 10/30/2017   Procedure: COLONOSCOPY WITH PROPOFOL;  Surgeon: [name], MD;  Location: [facility] ENDOSCOPY;  Service: Gastroenterology;   Laterality: N/A;  . ESOPHAGOGASTRODUODENOSCOPY (EGD) WITH PROPOFOL N/A 10/30/2017   Procedure: ESOPHAGOGASTRODUODENOSCOPY (EGD) WITH PROPOFOL;  Surgeon: [name], MD;  Location: [facility] ENDOSCOPY;  Service: Gastroenterology;  Laterality: N/A;    Prior to Admission medications   Medication Sig Start Date End Date Taking? Authorizing Provider  albuterol (PROVENTIL) (2.5 MG/3ML) 0.083% nebulizer solution Inhale into the lungs.    [provider]  fluticasone (FLONASE) 50 MCG/ACT nasal spray Place into the nose.    [provider]  GABAPENTIN PO Take by mouth.    [provider]  insulin detemir (LEVEMIR) 100 UNIT/ML injection Inject into the skin.    [provider]  Insulin Glargine (LANTUS SOLOSTAR) 100 UNIT/ML Solostar Pen Inject into the skin.    [provider]  Linagliptin (TRADJENTA PO) Take by mouth.    [provider]  Liraglutide (VICTOZA [place]) Inject into the skin.    [provider]  LISINOPRIL PO Take by mouth.    [provider]  omeprazole (PRILOSEC) 40 MG capsule Take 1 capsule (40 mg total) by mouth daily. 11/11/17   [name], MD  ondansetron (ZOFRAN-ODT) 4 MG disintegrating tablet TAKE 1 TABLET BY MOUTH EVERY 8 HOURS AS NEEDED FOR NAUSEA FOR UP TO 7 DAYS 10/11/17   [provider]  polyethylene glycol (MIRALAX) packet Take 17 g by mouth daily as needed for moderate constipation. Patient not taking: Reported on 10/30/2017 10/05/17   Gregor Hams, MD    Allergies Patient has no known allergies.  History reviewed. No pertinent  family history.  Social History Social History   Tobacco Use  . Smoking status: Never Smoker  . Smokeless tobacco: Never Used  Substance Use Topics  . Alcohol use: Never    Frequency: Never  . Drug use: Never    Review of Systems Constitutional: No fever/chills Eyes: No visual changes. ENT: No sore throat. No stiff neck no neck pain Cardiovascular: Denies chest  pain. Respiratory: Denies shortness of breath. Gastrointestinal:   no vomiting.  No diarrhea.  No constipation. Genitourinary: Negative for dysuria. Musculoskeletal: Negative lower extremity swelling Skin: Negative for rash. Neurological: Negative for severe headaches, focal weakness or numbness.  See above   ____________________________________________   PHYSICAL EXAM:  VITAL SIGNS: ED Triage Vitals [06/27/18 1833]  Enc Vitals Group     BP (!) 150/67     Pulse Rate (!) 110     Resp 20     Temp 98.1 F (36.7 C)     Temp Source Oral     SpO2 96 %     Weight 252 lb (114.3 kg)     Height 5\' 2"  (1.575 m)     Head Circumference      Peak Flow      Pain Score 10     Pain Loc      Pain Edu?      Excl. in [place]?     Constitutional: Alert and oriented. Well appearing and in no acute distress.  She is quite anxious Eyes: Conjunctivae are normal Head: Atraumatic HEENT: No congestion/rhinnorhea. Mucous membranes are moist.  Oropharynx non-erythematous Neck:   Nontender with no meningismus, no masses, no stridor Cardiovascular: Normal rate, regular rhythm. Grossly normal heart sounds.  Good peripheral circulation. Respiratory: Normal respiratory effort.  No retractions. Lungs CTAB. Abdominal: Soft and nontender. No distention. No guarding no rebound she is morbidly obese Back:  There is no focal tenderness or step off.  there is no midline tenderness there are no lesions noted. there is no CVA tendernes Musculoskeletal: No lower extremity tenderness, no upper extremity tenderness. No joint effusions, no DVT signs strong distal pulses no edema Neurologic:  Normal speech and language. No gross focal neurologic deficits are appreciated.  Strong distal pulses in her feet, there is no evidence of erythema or lesions, she states that her feet are somewhat tender to palpation.  Actively however her feet appear to be normal Skin:  Skin is warm, dry and intact. No rash noted. Psychiatric: Mood  and affect are quite anxious  ____________________________________________   LABS (all labs ordered are listed, but only abnormal results are displayed)  Labs Reviewed  CBC WITH DIFFERENTIAL/PLATELET  BASIC METABOLIC PANEL  CBG MONITORING, ED    Pertinent labs  results that were available during my care of the patient were reviewed by me and considered in my medical decision making (see chart for details). ____________________________________________  EKG  I personally interpreted any EKGs ordered by me or triage  ____________________________________________  RADIOLOGY  Pertinent labs & imaging results that were available during my care of the patient were reviewed by me and considered in my medical decision making (see chart for details). If possible, patient and/or family made aware of any abnormal findings.  No results found. ____________________________________________    PROCEDURES  Procedure(s) performed: None  Procedures  Critical Care performed: None  ____________________________________________   INITIAL IMPRESSION / ASSESSMENT AND PLAN / ED COURSE  Pertinent labs & imaging results that were available during my care of the patient were reviewed  by me and considered in my medical decision making (see chart for details).  Patient here because her nerves as she describes that have caused her peripheral neuropathy to "act up".  She wants only help with all this.  There is no evidence of acute pathology such as cauda equina syndrome or CVA etc.  There is no evidence of cellulitic or infectious processes.  We will give her something to calm her nerves, I did check her sugar, she has been very indiscrete with her diet and not particularly compliant with her insulin and her sugars are.  Will check basic blood work give her IV fluid is likely she is a little dehydrated with this.  This also will allow [place] to check and make sure that there is no significant electrolyte  abnormality contributing to her chronic neuropathies.  ----------------------------------------- 10:07 PM on 06/27/2018 -----------------------------------------  It is of DKA sugars are elevated here we have given her IV fluid she feels 100% better she would like a work note, she states that that was a large reason she came in here because she does not want to go to work with her neuropathy.  We will give her a work note she does have a ride home, she states her "nerves" feel better.  She does want a go home take her own insulin.  I offered insulin here.  I do not see any reason to keep her in the hospital she is noncompliant with her diabetic medication and her diabetic regimen and of course this results in elevated blood glucose but there is no evidence of acute pathology today have counseled her extensively about all these things and she will follow close with primary care.    ____________________________________________   FINAL CLINICAL IMPRESSION(S) / ED DIAGNOSES  Final diagnoses:  None      This chart was dictated using voice recognition software.  Despite best efforts to proofread,  errors can occur which can change meaning.      Schuyler Amor, MD 06/27/18 2041    Schuyler Amor, MD 06/27/18 2207

## 2018-06-27 NOTE — Discharge Instructions (Addendum)
Take your insulin as prescribed, do not drink or eat sugary food, follow closely with primary care.  Return to the emergency room for new or worrisome symptoms.

## 2018-06-27 NOTE — ED Triage Notes (Signed)
First Nurse Note:  C/O stabbing bilateral foot pain x 2 days.  States has taken gabepentin, with no relief.  Also states "My nerves are real bad".  AAOx3.  Skin warm and dry. NAD

## 2018-06-27 NOTE — ED Notes (Signed)
CBG 398 

## 2018-06-27 NOTE — ED Triage Notes (Signed)
States bilat foot neuropathy x 1 day. States took gabapentin and alleve without relief. A&O, in wheelchair. No distress noted.

## 2018-06-28 LAB — GLUCOSE, CAPILLARY
GLUCOSE-CAPILLARY: 378 mg/dL — AB (ref 70–99)
Glucose-Capillary: 334 mg/dL — ABNORMAL HIGH (ref 70–99)

## 2018-07-13 IMAGING — CT CT ABD-PELV W/ CM
2 of 5 series · 16 of 46 positions shown, 18 images · IV contrast (APPLIED)
Comparison: Lumbar radiographs 02/01/2012.

CLINICAL DATA: 51-year-old female with epigastric and lower
abdominal pain. Constipation for 2 months.

EXAM:
CT ABDOMEN AND PELVIS WITH CONTRAST
TECHNIQUE: Multidetector CT imaging of the abdomen and pelvis was performed
using the standard protocol following bolus administration of
intravenous contrast.
CONTRAST:  125mL [id number] IOPAMIDOL ([id number]) INJECTION 61%

[Series 2: routine abd/pel with · axial · 0.80mm/px · z∈[-506,-86]mm · 13 of 96 slices shown, 15 images]
[im 6/96  soft-tissue]
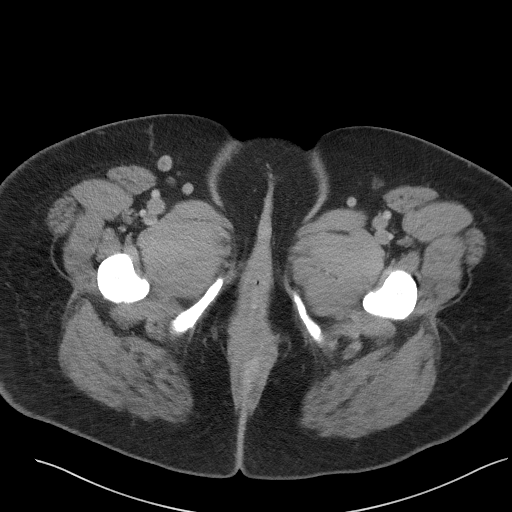
[im 6/96  bone]
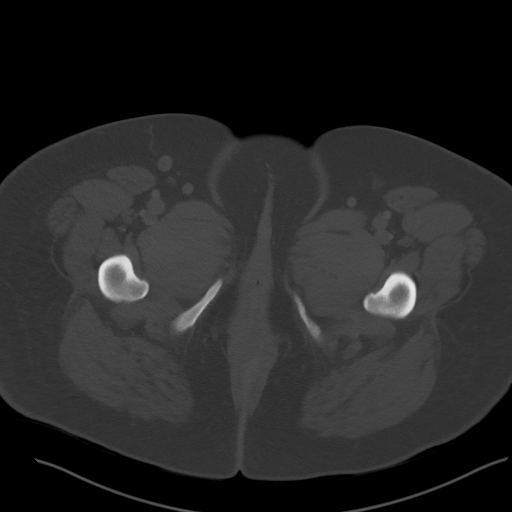
[im 12/96  soft-tissue]
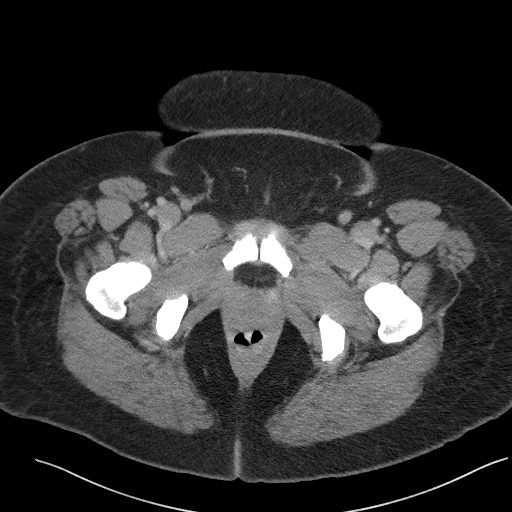
[im 23/96  soft-tissue]
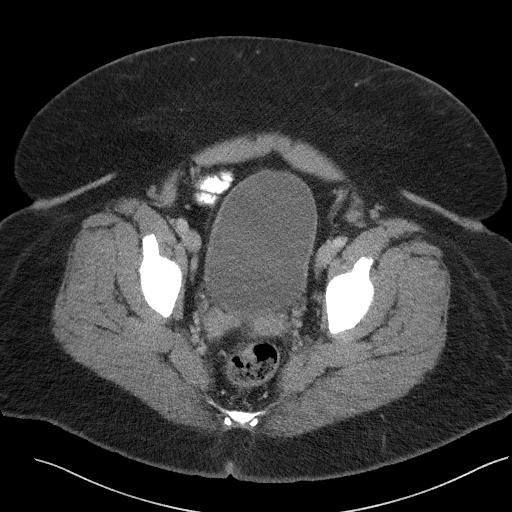
[im 28/96  soft-tissue]
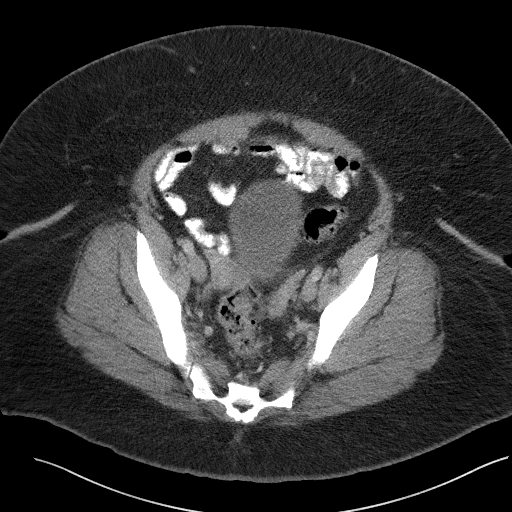
[im 34/96  soft-tissue]
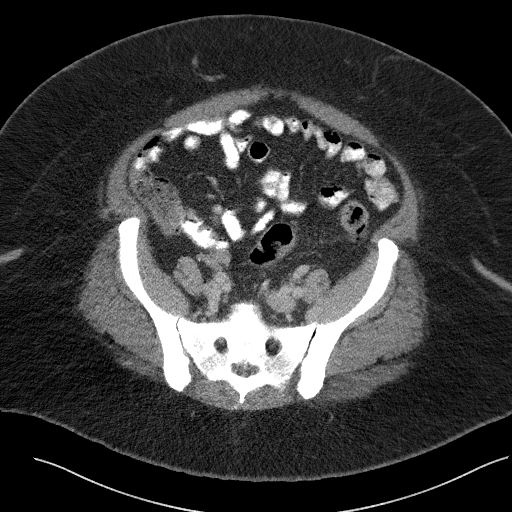
[im 40/96  soft-tissue]
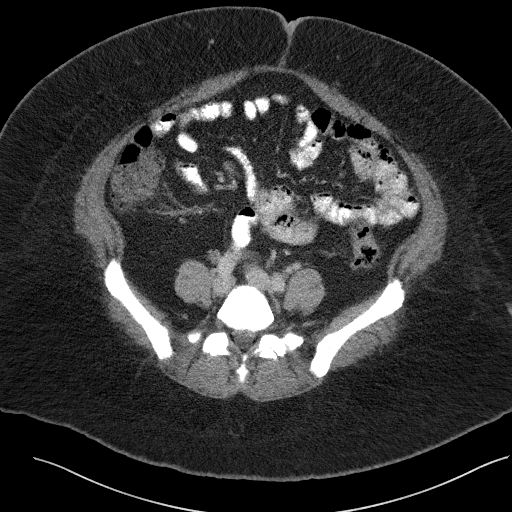
[im 51/96  soft-tissue]
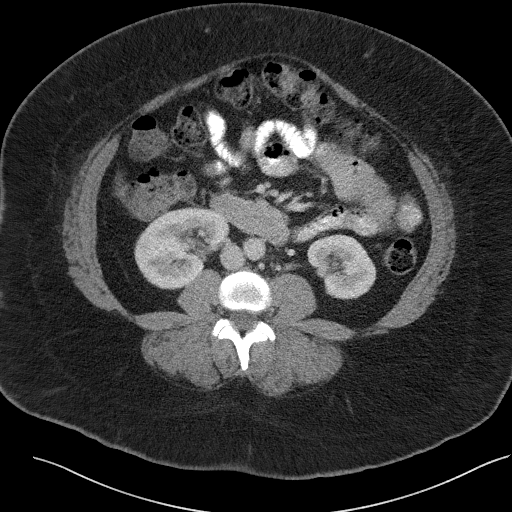
[im 56/96  soft-tissue]
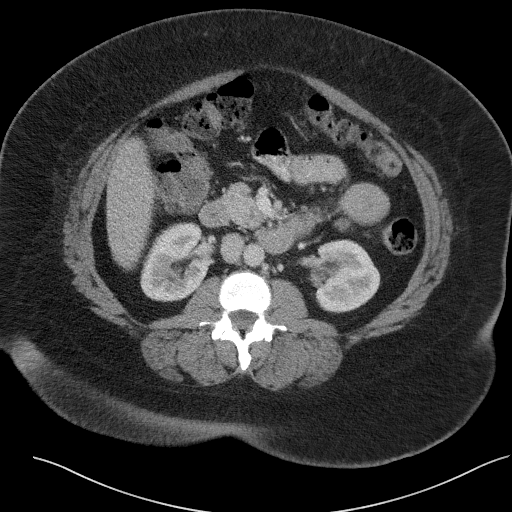
[im 62/96  soft-tissue]
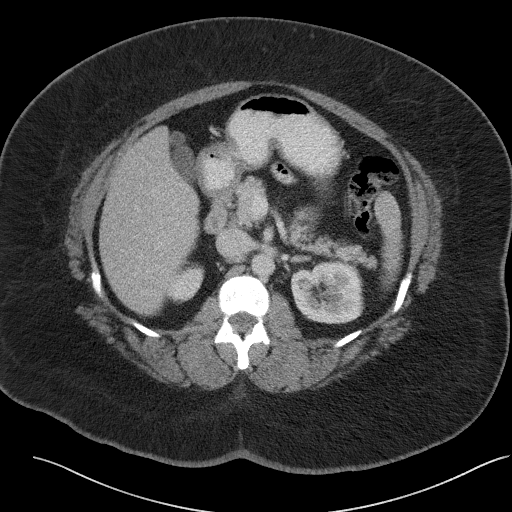
[im 62/96  bone]
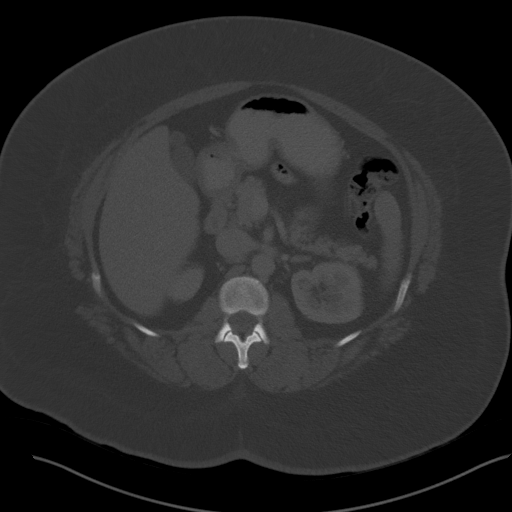
[im 68/96  soft-tissue]
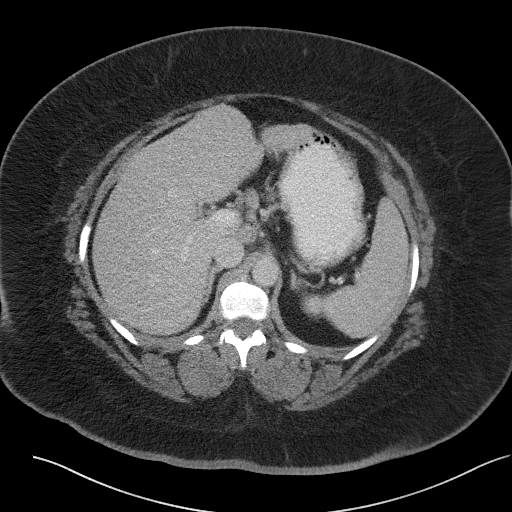
[im 73/96  soft-tissue]
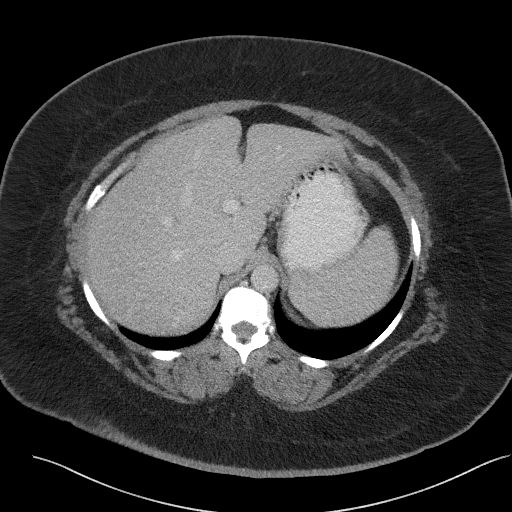
[im 84/96  soft-tissue]
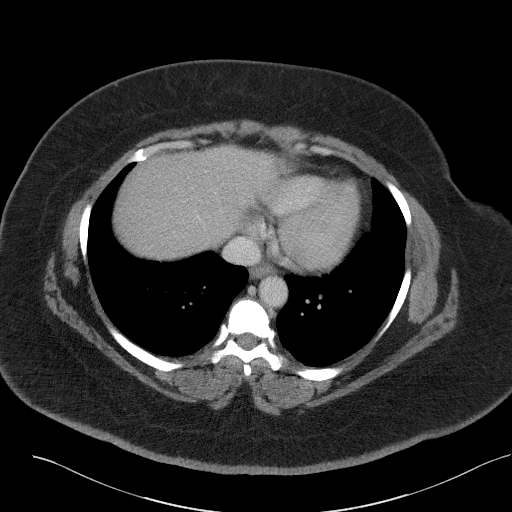
[im 90/96  soft-tissue]
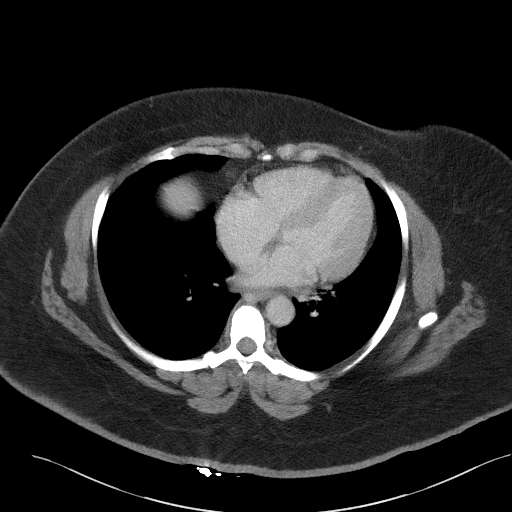

[Series 5: coronal st · coronal · 0.84mm/px · 3 of 117 slices shown]
[im 39/117  soft-tissue]
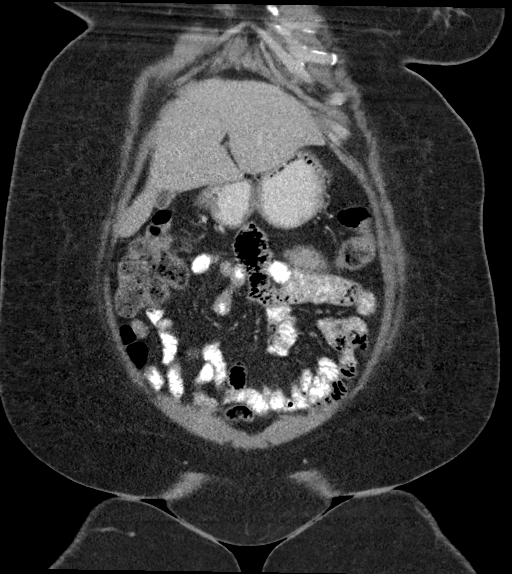
[im 52/117  soft-tissue]
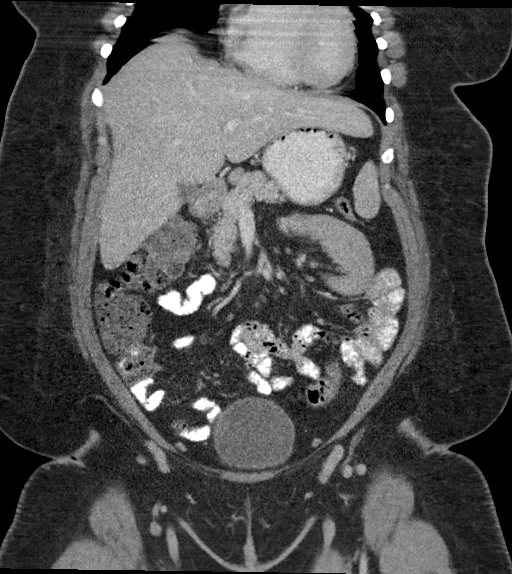
[im 65/117  soft-tissue]
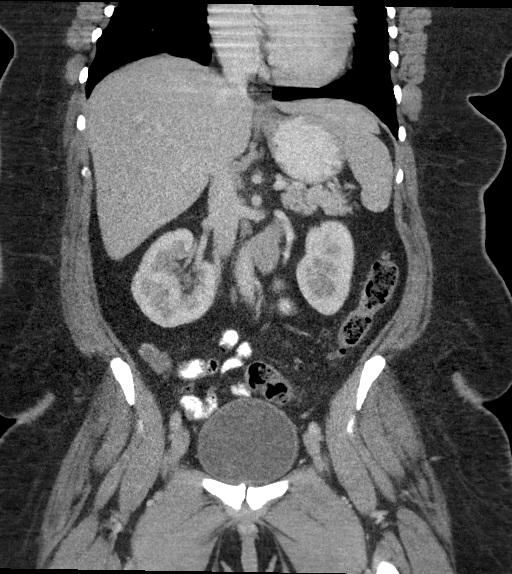

[16 of 46 positions shown; findings below may reference images not displayed]

FINDINGS: Lower chest: Borderline to mild cardiomegaly. Negative lung bases.
No pericardial or pleural effusion.

Hepatobiliary: Negative liver and gallbladder.

Pancreas: Negative.

Spleen: Negative.

Adrenals/Urinary Tract: Normal adrenal glands.

Bilateral renal enhancement and contrast excretion is symmetric and
within normal limits. A column of Bertin is noted on the left. There
is a somewhat horizontal right renal axis (normal variant). No
perinephric stranding. No periureteral stranding. Negative course of
both ureters. Gonadal vein and pelvic phleboliths occasionally
noted.

Mildly distended but otherwise unremarkable urinary bladder.

Stomach/Bowel: Unremarkable rectum, mild retained stool. Mild to
moderate diverticulosis in the proximal sigmoid colon and distal
descending colon but no active inflammation. Similar mild retained
stool throughout those segments. Similar retained stool in the
transverse colon. Redundant hepatic flexure. Similar retained stool
in the right colon. Oral contrast has just reached the ileocecal
valve. Normal appendix (coronal image 65). Negative terminal ileum.

No dilated small bowel. Negative stomach and duodenum. No mesenteric
inflammation.

No abdominal free air or free fluid.

Vascular/Lymphatic: Major arterial structures in the abdomen and
pelvis are patent. Portal venous system is patent.

No lymphadenopathy.

Reproductive: Surgically absent uterus.  Negative ovaries.

Other: No pelvic free fluid.

Musculoskeletal: Lower lumbar disc degeneration. No acute osseous
abnormality identified.
IMPRESSION: 1. No acute or inflammatory process identified in the abdomen or
pelvis.
2. Diverticulosis of the descending and sigmoid colon without active
inflammation. Normal appendix. Mild to moderate volume of retained
stool throughout the colon.

## 2018-09-15 DIAGNOSIS — E1165 Type 2 diabetes mellitus with hyperglycemia: Secondary | ICD-10-CM | POA: Insufficient documentation

## 2018-09-30 DIAGNOSIS — E113299 Type 2 diabetes mellitus with mild nonproliferative diabetic retinopathy without macular edema, unspecified eye: Secondary | ICD-10-CM | POA: Insufficient documentation

## 2018-10-25 ENCOUNTER — Ambulatory Visit: Admission: RE | Admit: 2018-10-25 | Payer: Self-pay | Source: Home / Self Care | Admitting: Unknown Physician Specialty

## 2018-10-25 ENCOUNTER — Encounter: Admission: RE | Payer: Self-pay | Source: Home / Self Care

## 2018-10-25 SURGERY — EGD (ESOPHAGOGASTRODUODENOSCOPY)
Anesthesia: General

## 2019-04-15 ENCOUNTER — Other Ambulatory Visit: Payer: Self-pay

## 2019-04-15 DIAGNOSIS — Z20822 Contact with and (suspected) exposure to covid-19: Secondary | ICD-10-CM

## 2019-04-16 LAB — NOVEL CORONAVIRUS, NAA: SARS-CoV-2, NAA: NOT DETECTED

## 2019-12-13 ENCOUNTER — Other Ambulatory Visit: Payer: Self-pay

## 2019-12-13 ENCOUNTER — Emergency Department
Admission: EM | Admit: 2019-12-13 | Discharge: 2019-12-13 | Disposition: A | Discharge status: Home / Self Care | Payer: Managed Care, Other (non HMO) | Attending: Student | Admitting: Student

## 2019-12-13 DIAGNOSIS — E1142 Type 2 diabetes mellitus with diabetic polyneuropathy: Secondary | ICD-10-CM | POA: Diagnosis not present

## 2019-12-13 DIAGNOSIS — R61 Generalized hyperhidrosis: Secondary | ICD-10-CM | POA: Diagnosis not present

## 2019-12-13 DIAGNOSIS — Z20822 Contact with and (suspected) exposure to covid-19: Secondary | ICD-10-CM | POA: Insufficient documentation

## 2019-12-13 DIAGNOSIS — R110 Nausea: Secondary | ICD-10-CM | POA: Diagnosis not present

## 2019-12-13 DIAGNOSIS — Z8669 Personal history of other diseases of the nervous system and sense organs: Secondary | ICD-10-CM

## 2019-12-13 DIAGNOSIS — N390 Urinary tract infection, site not specified: Secondary | ICD-10-CM

## 2019-12-13 DIAGNOSIS — Z794 Long term (current) use of insulin: Secondary | ICD-10-CM | POA: Diagnosis not present

## 2019-12-13 LAB — BASIC METABOLIC PANEL
Anion gap: 14 (ref 5–15)
BUN: 29 mg/dL — ABNORMAL HIGH (ref 6–20)
CO2: 27 mmol/L (ref 22–32)
Calcium: 9.3 mg/dL (ref 8.9–10.3)
Chloride: 97 mmol/L — ABNORMAL LOW (ref 98–111)
Creatinine, Ser: 1.37 mg/dL — ABNORMAL HIGH (ref 0.44–1.00)
GFR calc Af Amer: 51 mL/min — ABNORMAL LOW (ref >60)
GFR calc non Af Amer: 44 mL/min — ABNORMAL LOW (ref >60)
Glucose, Bld: 154 mg/dL — ABNORMAL HIGH (ref 70–99)
Potassium: 4.2 mmol/L (ref 3.5–5.1)
Sodium: 138 mmol/L (ref 135–145)

## 2019-12-13 LAB — CBC
HCT: 41.2 % (ref 36.0–46.0)
Hemoglobin: 13.7 g/dL (ref 12.0–15.0)
MCH: 27.9 pg (ref 26.0–34.0)
MCHC: 33.3 g/dL (ref 30.0–36.0)
MCV: 83.9 fL (ref 80.0–100.0)
Platelets: 332 10*3/uL (ref 150–400)
RBC: 4.91 MIL/uL (ref 3.87–5.11)
RDW: 13.3 % (ref 11.5–15.5)
WBC: 6.3 10*3/uL (ref 4.0–10.5)
nRBC: 0 % (ref 0.0–0.2)

## 2019-12-13 LAB — URINALYSIS, COMPLETE (UACMP) WITH MICROSCOPIC
Bilirubin Urine: NEGATIVE
Glucose, UA: >=500 mg/dL — AB
Hgb urine dipstick: NEGATIVE
Ketones, ur: NEGATIVE mg/dL
Leukocytes,Ua: NEGATIVE
Nitrite: POSITIVE — AB
Protein, ur: NEGATIVE mg/dL
Specific Gravity, Urine: 1.018 (ref 1.005–1.030)
pH: 5 (ref 5.0–8.0)

## 2019-12-13 LAB — TROPONIN I (HIGH SENSITIVITY): Troponin I (High Sensitivity): 4 ng/L (ref <18)

## 2019-12-13 LAB — SARS CORONAVIRUS 2 BY RT PCR (HOSPITAL ORDER, PERFORMED IN ~~LOC~~ HOSPITAL LAB): SARS Coronavirus 2: NEGATIVE

## 2019-12-13 MED ORDER — SODIUM CHLORIDE 0.9% FLUSH: 3.0000 mL | Freq: Once | INTRAVENOUS | Status: DC

## 2019-12-13 MED ORDER — CEPHALEXIN 500 MG PO CAPS
500.0000 mg | ORAL_CAPSULE | Freq: Two times a day (BID) | ORAL | 0 refills | Status: AC
Start: 2019-12-13 — End: 2019-12-20

## 2019-12-13 MED ORDER — SODIUM CHLORIDE 0.9 % IV BOLUS
1000.0000 mL | Freq: Once | INTRAVENOUS | Status: AC
  Administered 2019-12-13: 1000 mL via INTRAVENOUS

## 2019-12-13 MED ORDER — OXYCODONE HCL 5 MG PO TABS
5.0000 mg | ORAL_TABLET | Freq: Once | ORAL | Status: AC
  Administered 2019-12-13: 5 mg via ORAL
  Filled 2019-12-13: qty 1

## 2019-12-13 MED ORDER — SODIUM CHLORIDE 0.9 % IV SOLN
1.0000 g | Freq: Once | INTRAVENOUS | Status: AC
  Administered 2019-12-13: 1 g via INTRAVENOUS
  Filled 2019-12-13: qty 10

## 2019-12-13 NOTE — ED Triage Notes (Addendum)
Pt c/o neuropathy pain in her hands and feet have been flared up the past couple of days and is prescribed gabapentin with no relief.  Pt also c/o dizziness and sweats for the past 2 days that is not normal for her.

## 2019-12-13 NOTE — ED Provider Notes (Signed)
[facility] Emergency Department Provider Note  ____________________________________________   First MD Initiated Contact with Patient 12/13/19 1333     (approximate)  I have reviewed the triage vital signs and the nursing notes.  History  Chief Complaint Peripheral Neuropathy    HPI KEYSHAWN Ibarra is a 54 y.o. female with hx of DM, neuropathy, HTN, asthma, anxiety who presents to the ER with multiple complaints.  First, she reports worsening peripheral neuropathy that has been ongoing for 5 to 6 months.  She feels neuropathic pain in her bilateral hands and feet, she describes it as a pins-and-needles, sharp/stabbing pain.  Pain has been constant and progressively worsening over this 5 to 6 months.  Previously, she had some relief with prescribed gabapentin, but does not feel it is providing the same kind of relief recently.  Additionally, over the last few days she has experienced some nausea, sweating, and malodorous urine. Reports temp ~100. No vomiting, diarrhea, SOB, cough. No sick contacts. Has not been vaccinated against COVID.    Past Medical Hx Past Medical History:  Diagnosis Date  . Asthma   . Diabetes mellitus without complication ([employer])   . Hypertension   . Neuropathy     Problem List Patient Active Problem List   Diagnosis Date Noted  . Benign essential hypertension 04/03/2015  . Asthma 09/20/2014  . Chest pain, unspecified 09/20/2014  . Depression with anxiety 09/20/2014  . Diabetes ([place]) 09/20/2014  . Diabetic neuropathy ([place]) 09/20/2014  . Generalized anxiety disorder 09/20/2014  . Mixed hyperlipidemia 09/20/2014  . SOB (shortness of breath) 09/20/2014    Past Surgical Hx Past Surgical History:  Procedure Laterality Date  . ABDOMINAL HYSTERECTOMY    . COLONOSCOPY WITH PROPOFOL N/A 10/30/2017   Procedure: COLONOSCOPY WITH PROPOFOL;  Surgeon: [name], MD;  Location: [facility] ENDOSCOPY;  Service: Gastroenterology;  Laterality:  N/A;  . ESOPHAGOGASTRODUODENOSCOPY (EGD) WITH PROPOFOL N/A 10/30/2017   Procedure: ESOPHAGOGASTRODUODENOSCOPY (EGD) WITH PROPOFOL;  Surgeon: [name], MD;  Location: [facility] ENDOSCOPY;  Service: Gastroenterology;  Laterality: N/A;    Medications Prior to Admission medications   Medication Sig Start Date End Date Taking? Authorizing Provider  albuterol (PROVENTIL) (2.5 MG/3ML) 0.083% nebulizer solution Inhale into the lungs.    [provider]  fluticasone (FLONASE) 50 MCG/ACT nasal spray Place into the nose.    [provider]  GABAPENTIN PO Take by mouth.    [provider]  insulin detemir (LEVEMIR) 100 UNIT/ML injection Inject into the skin.    [provider]  Insulin Glargine (LANTUS SOLOSTAR) 100 UNIT/ML Solostar Pen Inject into the skin.    [provider]  Linagliptin (TRADJENTA PO) Take by mouth.    [provider]  Liraglutide (VICTOZA [place]) Inject into the skin.    [provider]  LISINOPRIL PO Take by mouth.    [provider]  omeprazole (PRILOSEC) 40 MG capsule Take 1 capsule (40 mg total) by mouth daily. 11/11/17   [name], MD  ondansetron (ZOFRAN-ODT) 4 MG disintegrating tablet TAKE 1 TABLET BY MOUTH EVERY 8 HOURS AS NEEDED FOR NAUSEA FOR UP TO 7 DAYS 10/11/17   [provider]    Allergies Patient has no known allergies.  Family Hx No family history on file.  Social Hx Social History   Tobacco Use  . Smoking status: Never Smoker  . Smokeless tobacco: Never Used  Substance Use Topics  . Alcohol use: Never  . Drug use: Never  Review of Systems  Constitutional: Negative for fever. Negative for chills. Eyes: Negative for visual changes. ENT: Negative for sore throat. Cardiovascular: Negative for chest pain. Respiratory: Negative for shortness of breath. Gastrointestinal: + for nausea. Negative for vomiting.  Genitourinary: + malodorous urine Musculoskeletal: Negative for leg  swelling. Skin: Negative for rash. + diaphoresis  Neurological: Negative for headaches. + peripheral neuropathy    Physical Exam  Vital Signs: ED Triage Vitals  Enc Vitals Group     BP 12/13/19 1241 131/74     Pulse Rate 12/13/19 1241 87     Resp 12/13/19 1241 17     Temp 12/13/19 1244 98.3 F (36.8 C)     Temp Source 12/13/19 1241 Oral     SpO2 12/13/19 1241 97 %     Weight 12/13/19 1242 250 lb (113.4 kg)     Height 12/13/19 1242 5\' 2"  (1.575 m)     Head Circumference --      Peak Flow --      Pain Score 12/13/19 1241 10     Pain Loc --      Pain Edu? --      Excl. in [place]? --     Constitutional: Alert and oriented. Well appearing. NAD.  Head: Normocephalic. Atraumatic. Eyes: Conjunctivae clear. Sclera anicteric. Pupils equal and symmetric. Nose: No masses or lesions. No congestion or rhinorrhea. Mouth/Throat: Wearing mask.  Neck: No stridor. Trachea midline.  Cardiovascular: Normal rate, regular rhythm. Extremities well perfused. Respiratory: Normal respiratory effort.  Lungs CTAB. Gastrointestinal: Soft. Non-distended. Non-tender.  Genitourinary: Deferred. Musculoskeletal: No lower extremity edema. No deformities. Neurologic:  Normal speech and language. No gross focal or lateralizing neurologic deficits are appreciated. Alert and oriented.  Face symmetric.  Tongue midline.  Cranial nerves II through XII intact. UE and LE strength 5/5 and symmetric. UE and LE SILT.  Skin: Skin is warm, dry and intact. No rash noted. Psychiatric: Mood and affect are appropriate for situation. Appropriately tearful when talking about her pain.   EKG  Personally reviewed and interpreted by myself.   Date: 12/13/19 Time: 1306 Rate: 101 Rhythm: sinus Axis: normal Intervals: WNL Artifact, but no obvious acute ischemic changes No STEMI    Procedures  Procedure(s) performed (including critical care):  Procedures   Initial Impression / Assessment and Plan / MDM / ED Course  54  y.o. female who presents to the ED for acute on chronic peripheral neuropathy x 5-6 months, as well as nausea, sweats, malodorous urine x several days.   Work up seems consistent with UTI, UA hazy, positive nitrites, WBC, bacteria. Especially in the setting of her symptoms, will plan to treat. Perhaps UTI could be worsening her neuropathy as well. Electrolytes, including calcium, w/o actionable derangements. Mild AKI. No anemia. EKG w/o acute ischemia or e/o arrhythmia.   Will plan for bolus of IVF and first dose antibiotics here, as well as symptom control, and then plan for d/c with Rx for antibiotics for UTI as well as Neurology referral for assistance w/ managing her chronic neuropathy. Patient voices understanding and is in agreement w/ the plan.  COVID negative. Patient feeling improved. Troponin negative. EKG w/o acute ischemia or e/o arrhythmia. As such, feel patient is appropriate for discharge w/ outpatient follow up. Given return precautions.    _______________________________   As part of my medical decision making I have reviewed available labs, radiology tests, reviewed old records/performed chart review.    Final Clinical Impression(s) / ED Diagnosis  UTI Peripheral  neuropathy   Note:  This document was prepared using Dragon voice recognition software and may include unintentional dictation errors.   Lilia Pro., MD 12/13/19 [phone]

## 2019-12-13 NOTE — ED Notes (Signed)
See triage note  Presents with pain to hands and feet  States she had neuropathy    Thinks that pain has gotten worse over the past couple of days

## 2019-12-13 NOTE — Discharge Instructions (Addendum)
Thank you for letting us take care of you in the emergency department today. Your COVID test was negative.   Please continue to take any regular, prescribed medications.   New medications we have prescribed:  Keflex, for urine infection  Please follow up with: Your primary care doctor to review your ER visit and follow up on your symptoms.  Dr. Melrose Nakayama, Neurology doctor, to review your neuropathy and management options   Please return to the ER for any new or worsening symptoms.

## 2021-03-06 ENCOUNTER — Other Ambulatory Visit: Payer: Self-pay

## 2021-03-06 DIAGNOSIS — M545 Low back pain, unspecified: Secondary | ICD-10-CM

## 2021-04-01 ENCOUNTER — Ambulatory Visit
Admission: RE | Admit: 2021-04-01 | Discharge: 2021-04-01 | Disposition: A | Discharge status: Home / Self Care | Payer: Disability Insurance | Source: Ambulatory Visit

## 2021-04-01 ENCOUNTER — Ambulatory Visit
Admission: RE | Admit: 2021-04-01 | Discharge: 2021-04-01 | Disposition: A | Discharge status: Home / Self Care | Payer: Disability Insurance

## 2021-04-01 DIAGNOSIS — M545 Low back pain, unspecified: Secondary | ICD-10-CM | POA: Diagnosis not present

## 2021-10-14 ENCOUNTER — Emergency Department: Payer: Disability Insurance

## 2021-10-14 ENCOUNTER — Other Ambulatory Visit: Payer: Self-pay

## 2021-10-14 ENCOUNTER — Emergency Department
Admission: EM | Admit: 2021-10-14 | Discharge: 2021-10-14 | Disposition: A | Discharge status: Home / Self Care | Payer: Disability Insurance | Attending: Emergency Medicine | Admitting: Emergency Medicine

## 2021-10-14 DIAGNOSIS — M542 Cervicalgia: Secondary | ICD-10-CM | POA: Diagnosis not present

## 2021-10-14 DIAGNOSIS — Z20822 Contact with and (suspected) exposure to covid-19: Secondary | ICD-10-CM | POA: Insufficient documentation

## 2021-10-14 DIAGNOSIS — R0789 Other chest pain: Secondary | ICD-10-CM | POA: Insufficient documentation

## 2021-10-14 DIAGNOSIS — R200 Anesthesia of skin: Secondary | ICD-10-CM | POA: Diagnosis not present

## 2021-10-14 DIAGNOSIS — E1165 Type 2 diabetes mellitus with hyperglycemia: Secondary | ICD-10-CM | POA: Diagnosis not present

## 2021-10-14 LAB — COMPREHENSIVE METABOLIC PANEL
ALT: 18 U/L (ref 0–44)
AST: 20 U/L (ref 15–41)
Albumin: 3.9 g/dL (ref 3.5–5.0)
Alkaline Phosphatase: 60 U/L (ref 38–126)
Anion gap: 12 (ref 5–15)
BUN: 20 mg/dL (ref 6–20)
CO2: 24 mmol/L (ref 22–32)
Calcium: 8.9 mg/dL (ref 8.9–10.3)
Chloride: 99 mmol/L (ref 98–111)
Creatinine, Ser: 0.94 mg/dL (ref 0.44–1.00)
GFR, Estimated: >60 mL/min (ref >60)
Glucose, Bld: 271 mg/dL — ABNORMAL HIGH (ref 70–99)
Potassium: 3.6 mmol/L (ref 3.5–5.1)
Sodium: 135 mmol/L (ref 135–145)
Total Bilirubin: 0.5 mg/dL (ref 0.3–1.2)
Total Protein: 7 g/dL (ref 6.5–8.1)

## 2021-10-14 LAB — URINALYSIS, ROUTINE W REFLEX MICROSCOPIC
Bilirubin Urine: NEGATIVE
Glucose, UA: >=500 mg/dL — AB
Hgb urine dipstick: NEGATIVE
Ketones, ur: NEGATIVE mg/dL
Leukocytes,Ua: NEGATIVE
Nitrite: POSITIVE — AB
Protein, ur: NEGATIVE mg/dL
Specific Gravity, Urine: 1.018 (ref 1.005–1.030)
pH: 5 (ref 5.0–8.0)

## 2021-10-14 LAB — CBC
HCT: 39.9 % (ref 36.0–46.0)
Hemoglobin: 13 g/dL (ref 12.0–15.0)
MCH: 27.7 pg (ref 26.0–34.0)
MCHC: 32.6 g/dL (ref 30.0–36.0)
MCV: 85.1 fL (ref 80.0–100.0)
Platelets: 308 10*3/uL (ref 150–400)
RBC: 4.69 MIL/uL (ref 3.87–5.11)
RDW: 13.7 % (ref 11.5–15.5)
WBC: 5.3 10*3/uL (ref 4.0–10.5)
nRBC: 0 % (ref 0.0–0.2)

## 2021-10-14 LAB — RESP PANEL BY RT-PCR (FLU A&B, COVID) ARPGX2
Influenza A by PCR: NEGATIVE
Influenza B by PCR: NEGATIVE
SARS Coronavirus 2 by RT PCR: NEGATIVE

## 2021-10-14 LAB — LIPASE, BLOOD: Lipase: 29 U/L (ref 11–51)

## 2021-10-14 LAB — CBG MONITORING, ED: Glucose-Capillary: 253 mg/dL — ABNORMAL HIGH (ref 70–99)

## 2021-10-14 LAB — TROPONIN I (HIGH SENSITIVITY)
Troponin I (High Sensitivity): 4 ng/L (ref <18)
Troponin I (High Sensitivity): 4 ng/L (ref <18)

## 2021-10-14 NOTE — ED Provider Notes (Signed)
? ?[facility] ?Provider Note ? ? Event Date/Time  ? First MD Initiated Contact with Patient 10/14/21 2116   ?  (approximate) ?History  ?Chest Pain ? ?HPI ?Kristen Ibarra is a 56 y.o. female with stated past medical history of type 2 diabetes who presents for left upper chest wall pain in addition to left upper extremity and left-sided neck pain that began yesterday and has somewhat improved since onset.  Patient states that this is a dull aching 10/10 pain and occasionally will have sharp shooting pains that go down the left arm.  Patient also states that she has a known history of cervical arthritis.  Patient denies any associated shortness of breath, dyspnea on exertion, crushing central chest pain.  Patient does endorse associated hyperglycemia as well as vomiting and diarrhea that resolved 3 days prior to symptoms began ?Physical Exam  ?Triage Vital Signs: ?ED Triage Vitals  ?Enc Vitals Group  ?   BP 10/14/21 1759 124/65  ?   Pulse Rate 10/14/21 1759 79  ?   Resp 10/14/21 1759 18  ?   Temp 10/14/21 1759 98 ?F (36.7 ?C)  ?   Temp Source 10/14/21 2023 Oral  ?   SpO2 10/14/21 1759 97 %  ?   Weight --   ?   Height --   ?   Head Circumference --   ?   Peak Flow --   ?   Pain Score 10/14/21 1757 10  ?   Pain Loc --   ?   Pain Edu? --   ?   Excl. in [place]? --   ? ?Most recent vital signs: ?Vitals:  ? 10/14/21 2023 10/14/21 2230  ?BP: 103/65   ?Pulse: 67 66  ?Resp: 18 15  ?Temp: 98.4 ?F (36.9 ?C)   ?SpO2: 94% 98%  ? ?General: Awake, oriented x4. ?CV:  Good peripheral perfusion.  ?Resp:  Normal effort.  ?Abd:  No distention.  ?Other:  Middle-aged overweight African-American female laying in bed in no distress.  Mild tenderness to palpation over the left lateral cervical paraspinal musculature with negative Spurling test on the left ?ED Results / Procedures / Treatments  ?Labs ?(all labs ordered are listed, but only abnormal results are displayed) ?Labs Reviewed  ?URINALYSIS, ROUTINE W REFLEX  MICROSCOPIC - Abnormal; Notable for the following components:  ?    Result Value  ? Color, Urine YELLOW (*)   ? APPearance CLEAR (*)   ? Glucose, UA >=500 (*)   ? Nitrite POSITIVE (*)   ? Bacteria, UA RARE (*)   ? All other components within normal limits  ?COMPREHENSIVE METABOLIC PANEL - Abnormal; Notable for the following components:  ? Glucose, Bld 271 (*)   ? All other components within normal limits  ?CBG MONITORING, ED - Abnormal; Notable for the following components:  ? Glucose-Capillary 253 (*)   ? All other components within normal limits  ?RESP PANEL BY RT-PCR (FLU A&B, COVID) ARPGX2  ?CBC  ?LIPASE, BLOOD  ?TROPONIN I (HIGH SENSITIVITY)  ?TROPONIN I (HIGH SENSITIVITY)  ? ?EKG ?ED ECG REPORT ?I, Naaman Plummer, the attending physician, personally viewed and interpreted this ECG. ?Date: 10/14/2021 ?EKG Time: 1803 ?Rate: 73 ?Rhythm: normal sinus rhythm ?QRS Axis: normal ?Intervals: normal ?ST/T Wave abnormalities: normal ?Narrative Interpretation: no evidence of acute ischemia ?RADIOLOGY ?ED MD interpretation: 2 view chest x-ray interpreted by me shows no evidence of acute abnormalities including no pneumonia, pneumothorax, or widened mediastinum ?-Agree with radiology assessment ?  Official radiology report(s): ?DG Chest 2 View ? ?Result Date: 10/14/2021 ?CLINICAL DATA:  Chest pain EXAM: CHEST - 2 VIEW COMPARISON:  Chest x-ray dated August 23, 2013 FINDINGS: The heart size and mediastinal contours are within normal limits. Both lungs are clear. The visualized skeletal structures are unremarkable. IMPRESSION: No active cardiopulmonary disease. Electronically Signed   By: Yetta Glassman M.D.   On: 10/14/2021 18:36   ?PROCEDURES: ?Critical Care performed: No ?.1-3 Lead EKG Interpretation ?Performed by: Naaman Plummer, MD ?Authorized by: Naaman Plummer, MD  ? ?  Interpretation: normal   ?  ECG rate:  63 ?  ECG rate assessment: normal   ?  Rhythm: sinus rhythm   ?  Ectopy: none   ?  Conduction: normal    ?MEDICATIONS ORDERED IN ED: ?Medications - No data to display ?IMPRESSION / MDM / ASSESSMENT AND PLAN / ED COURSE  ?I reviewed the triage vital signs and the nursing notes. ?             ?               ?The patient is on the cardiac monitor to evaluate for evidence of arrhythmia and/or significant heart rate changes. ?Workup: ECG, CXR, CBC, BMP, Troponin ?Findings: ?ECG: No overt evidence of STEMI. No evidence of Brugada?s sign, delta wave, epsilon wave, significantly prolonged QTc, or malignant arrhythmia ?HS Troponin: Negative x1 ?Other Labs unremarkable for emergent problems. ?CXR: Without PTX, PNA, or widened mediastinum ?Last Stress Test: Never ?Last Heart Catheterization: Never ?HEART Score: 3 ? ?Given History, Exam, and Workup I have low suspicion for ACS, Pneumothorax, Pneumonia, Pulmonary Embolus, Tamponade, Aortic Dissection or other emergent problem as a cause for this presentation.  ? ?Reassesment: Prior to discharge patient?s pain was controlled and they were well appearing.  Given distribution of this patient's pain, I am concerned for possible cervical inflammation/injury/nerve impingement.  Patient given instructions for NSAIDs as well as cervical physical therapy exercises to perform at home.  Patient instructed to follow-up with cardiology given significant family history of heart disease. ? ?Disposition:  Discharge. Strict return precautions discussed with patient with full understanding. Advised patient to follow up promptly with primary care provider ? ?  ?FINAL CLINICAL IMPRESSION(S) / ED DIAGNOSES  ? ?Final diagnoses:  ?Chest wall pain  ?Neck pain on left side  ?Pain and numbness of left upper extremity  ? ?Rx / DC Orders  ? ?ED Discharge Orders   ? ? None  ? ?  ? ?Note:  This document was prepared using Dragon voice recognition software and may include unintentional dictation errors. ?  ?Naaman Plummer, MD ?10/15/21 1906 ? ?

## 2021-10-14 NOTE — ED Triage Notes (Signed)
Pt comes with c/o CP that is left sided and radiates to left arm. Pt states her arm was so heavy yesterday she couldn't raise it.  ? ?Pt states vomiting and diarrhea. Pt states her sugar has been high some days. ?

## 2021-10-14 NOTE — ED Provider Triage Note (Signed)
Emergency Medicine Provider Triage Evaluation Note ? ?Kristen Ibarra , a 56 y.o. female  was evaluated in triage.  Pt complains of chest pain, left arm heaviness, stomach pain diffusely, nausea, diarrhea, elevated blood sugar reading.  Patient has had the symptoms since yesterday.  No reported fevers or chills.  Patient denies any cardiac history.  No medications for this complaint prior to arrival.  Patient's blood glucose has been running in the mid 200s where it typically runs low 100s.. ? ?Review of Systems  ?Positive: Chest pain, nausea, diarrhea, diffuse abdominal pain ?Negative: Fevers, chills, nasal congestion, sore throat, cough ? ?Physical Exam  ?BP 124/65   Pulse 79   Temp 98 ?F (36.7 ?C)   Resp 18   SpO2 97%  ?Gen:   Awake, no distress   ?Resp:  Normal effort  ?MSK:   Moves extremities without difficulty  ?Other:   ? ?Medical Decision Making  ?Medically screening exam initiated at 6:02 PM.  Appropriate orders placed.  Kristen Ibarra was informed that the remainder of the evaluation will be completed by another provider, this initial triage assessment does not replace that evaluation, and the importance of remaining in the ED until their evaluation is complete. ? ?Patient arrives with multiple complaints to include chest pain, diffuse abdominal pain, nausea, diarrhea, elevated glucose.  Patient will have labs, chest x-ray, EKG at this time. ?  ?Darletta Moll, PA-C ?10/14/21 1802 ? ?

## 2022-02-18 ENCOUNTER — Emergency Department
Admission: EM | Admit: 2022-02-18 | Discharge: 2022-02-19 | Disposition: A | Discharge status: Home / Self Care | Payer: Disability Insurance | Attending: Emergency Medicine | Admitting: Emergency Medicine

## 2022-02-18 ENCOUNTER — Other Ambulatory Visit: Payer: Self-pay

## 2022-02-18 DIAGNOSIS — Z79899 Other long term (current) drug therapy: Secondary | ICD-10-CM | POA: Insufficient documentation

## 2022-02-18 DIAGNOSIS — Z7984 Long term (current) use of oral hypoglycemic drugs: Secondary | ICD-10-CM | POA: Diagnosis not present

## 2022-02-18 DIAGNOSIS — N390 Urinary tract infection, site not specified: Secondary | ICD-10-CM | POA: Diagnosis not present

## 2022-02-18 DIAGNOSIS — I10 Essential (primary) hypertension: Secondary | ICD-10-CM | POA: Insufficient documentation

## 2022-02-18 DIAGNOSIS — J45909 Unspecified asthma, uncomplicated: Secondary | ICD-10-CM | POA: Diagnosis not present

## 2022-02-18 DIAGNOSIS — Z7951 Long term (current) use of inhaled steroids: Secondary | ICD-10-CM | POA: Insufficient documentation

## 2022-02-18 DIAGNOSIS — E119 Type 2 diabetes mellitus without complications: Secondary | ICD-10-CM | POA: Insufficient documentation

## 2022-02-18 DIAGNOSIS — Z794 Long term (current) use of insulin: Secondary | ICD-10-CM | POA: Diagnosis not present

## 2022-02-18 DIAGNOSIS — R200 Anesthesia of skin: Secondary | ICD-10-CM | POA: Diagnosis not present

## 2022-02-18 DIAGNOSIS — R1031 Right lower quadrant pain: Secondary | ICD-10-CM | POA: Diagnosis present

## 2022-02-18 LAB — CBC
HCT: 39.3 % (ref 36.0–46.0)
Hemoglobin: 13.1 g/dL (ref 12.0–15.0)
MCH: 28.2 pg (ref 26.0–34.0)
MCHC: 33.3 g/dL (ref 30.0–36.0)
MCV: 84.7 fL (ref 80.0–100.0)
Platelets: 306 10*3/uL (ref 150–400)
RBC: 4.64 MIL/uL (ref 3.87–5.11)
RDW: 13.2 % (ref 11.5–15.5)
WBC: 6.8 10*3/uL (ref 4.0–10.5)
nRBC: 0 % (ref 0.0–0.2)

## 2022-02-18 LAB — URINALYSIS, ROUTINE W REFLEX MICROSCOPIC
Bilirubin Urine: NEGATIVE
Glucose, UA: NEGATIVE mg/dL
Hgb urine dipstick: NEGATIVE
Ketones, ur: NEGATIVE mg/dL
Leukocytes,Ua: NEGATIVE
Nitrite: POSITIVE — AB
Protein, ur: NEGATIVE mg/dL
Specific Gravity, Urine: 1.011 (ref 1.005–1.030)
pH: 5 (ref 5.0–8.0)

## 2022-02-18 LAB — COMPREHENSIVE METABOLIC PANEL
ALT: 18 U/L (ref 0–44)
AST: 18 U/L (ref 15–41)
Albumin: 4.1 g/dL (ref 3.5–5.0)
Alkaline Phosphatase: 58 U/L (ref 38–126)
Anion gap: 9 (ref 5–15)
BUN: 21 mg/dL — ABNORMAL HIGH (ref 6–20)
CO2: 26 mmol/L (ref 22–32)
Calcium: 9.5 mg/dL (ref 8.9–10.3)
Chloride: 104 mmol/L (ref 98–111)
Creatinine, Ser: 1.02 mg/dL — ABNORMAL HIGH (ref 0.44–1.00)
GFR, Estimated: >60 mL/min (ref >60)
Glucose, Bld: 105 mg/dL — ABNORMAL HIGH (ref 70–99)
Potassium: 3.9 mmol/L (ref 3.5–5.1)
Sodium: 139 mmol/L (ref 135–145)
Total Bilirubin: 0.8 mg/dL (ref 0.3–1.2)
Total Protein: 7.1 g/dL (ref 6.5–8.1)

## 2022-02-18 LAB — LIPASE, BLOOD: Lipase: 26 U/L (ref 11–51)

## 2022-02-18 MED ORDER — ONDANSETRON 4 MG PO TBDP
4.0000 mg | ORAL_TABLET | Freq: Once | ORAL | Status: AC
  Administered 2022-02-18: 4 mg via ORAL
  Filled 2022-02-18: qty 1

## 2022-02-18 MED ORDER — KETOROLAC TROMETHAMINE 30 MG/ML IJ SOLN
30.0000 mg | Freq: Once | INTRAMUSCULAR | Status: AC
  Administered 2022-02-19: 30 mg via INTRAVENOUS
  Filled 2022-02-18: qty 1

## 2022-02-18 MED ORDER — ONDANSETRON HCL 4 MG/2ML IJ SOLN
4.0000 mg | Freq: Once | INTRAMUSCULAR | Status: AC
  Administered 2022-02-19: 4 mg via INTRAVENOUS
  Filled 2022-02-18: qty 2

## 2022-02-18 MED ORDER — SODIUM CHLORIDE 0.9 % IV SOLN
1.0000 g | Freq: Once | INTRAVENOUS | Status: AC
  Administered 2022-02-19: 1 g via INTRAVENOUS
  Filled 2022-02-18: qty 10

## 2022-02-18 NOTE — ED Notes (Signed)
ED Provider at bedside. 

## 2022-02-18 NOTE — ED Notes (Signed)
Pt brought to ED rm 6 at this time. This RN now assuming care.

## 2022-02-18 NOTE — ED Triage Notes (Signed)
Pt here with nausea, joint pain, back pain, and dark stools. Pt denies ulcer or bleeding. Pt also have weight gain due to extremity swelling that is intermittent per pt.

## 2022-02-18 NOTE — ED Provider Triage Note (Signed)
Emergency Medicine Provider Triage Evaluation Note  Kristen Ibarra , a 56 y.o. female  was evaluated in triage.  Pt complains of nausea, back pain, joint pain, dark stools, urine color change, glucose elevated, edema with weight gain or loss from 4-9 lbs Intermittently.    Review of Systems  Positive: Multiple complaints Negative: - prior GI bleed, no ulcer, no fever, chills.    Physical Exam  BP (!) 124/98 (BP Location: Right Arm)   Pulse 83   Temp 98.2 F (36.8 C) (Oral)   Resp 18   SpO2 99%  Gen:   Awake, no distress   Resp:  Normal effort Lungs clear bilat.   MSK:   Moves extremities without difficulty  No edema lower extremities now.  Other:    Medical Decision Making  Medically screening exam initiated at 3:10 PM.  Appropriate orders placed.  Kristen Ibarra was informed that the remainder of the evaluation will be completed by another provider, this initial triage assessment does not replace that evaluation, and the importance of remaining in the ED until their evaluation is complete.     Johnn Hai, PA-C 02/18/22 [phone]
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
## 2022-02-19 ENCOUNTER — Emergency Department: Payer: Disability Insurance

## 2022-02-19 LAB — BRAIN NATRIURETIC PEPTIDE: B Natriuretic Peptide: 6.6 pg/mL (ref 0.0–100.0)

## 2022-02-19 LAB — CK: Total CK: 52 U/L (ref 38–234)

## 2022-02-19 MED ORDER — IBUPROFEN 800 MG PO TABS
800.0000 mg | ORAL_TABLET | Freq: Once | ORAL | Status: AC
  Administered 2022-02-19: 800 mg via ORAL
  Filled 2022-02-19: qty 1

## 2022-02-19 MED ORDER — PREGABALIN 50 MG PO CAPS
100.0000 mg | ORAL_CAPSULE | Freq: Once | ORAL | Status: AC
  Administered 2022-02-19: 100 mg via ORAL
  Filled 2022-02-19: qty 2

## 2022-02-19 MED ORDER — LORAZEPAM 2 MG/ML IJ SOLN: 1.0000 mg | Freq: Once | INTRAMUSCULAR | Status: DC | PRN

## 2022-02-19 MED ORDER — IOHEXOL 300 MG/ML  SOLN
100.0000 mL | Freq: Once | INTRAMUSCULAR | Status: AC | PRN
  Administered 2022-02-19: 100 mL via INTRAVENOUS

## 2022-02-19 MED ORDER — CEFDINIR 300 MG PO CAPS: 300.0000 mg | ORAL_CAPSULE | Freq: Two times a day (BID) | ORAL | 0 refills | Status: DC

## 2022-02-19 NOTE — ED Notes (Signed)
Pt taken to MRI  

## 2022-02-19 NOTE — Discharge Instructions (Signed)
You may alternate Tylenol 1000 mg every 6 hours as needed for pain, fever and Ibuprofen 800 mg every 6-8 hours as needed for pain, fever.  Please take Ibuprofen with food.  Do not take more than 4000 mg of Tylenol (acetaminophen) in a 24 hour period. ° °

## 2022-02-19 NOTE — ED Notes (Signed)
Pt returned from MRI °

## 2022-02-19 NOTE — ED Notes (Signed)
This RN attempted IV x2 and was unsuccessful. Another RN will attempt.

## 2022-02-19 NOTE — ED Notes (Signed)
Signing pad not working. Pt verbalized understanding of her dc instructions.

## 2022-02-21 LAB — URINE CULTURE: Culture: >=100000 — AB

## 2022-04-22 ENCOUNTER — Emergency Department: Payer: Disability Insurance

## 2022-04-22 ENCOUNTER — Other Ambulatory Visit: Payer: Self-pay

## 2022-04-22 ENCOUNTER — Emergency Department
Admission: EM | Admit: 2022-04-22 | Discharge: 2022-04-22 | Disposition: A | Discharge status: Home / Self Care | Payer: Disability Insurance | Attending: Emergency Medicine | Admitting: Emergency Medicine

## 2022-04-22 ENCOUNTER — Encounter: Payer: Self-pay | Admitting: Emergency Medicine

## 2022-04-22 DIAGNOSIS — E1140 Type 2 diabetes mellitus with diabetic neuropathy, unspecified: Secondary | ICD-10-CM | POA: Insufficient documentation

## 2022-04-22 DIAGNOSIS — M791 Myalgia, unspecified site: Secondary | ICD-10-CM | POA: Insufficient documentation

## 2022-04-22 DIAGNOSIS — M5441 Lumbago with sciatica, right side: Secondary | ICD-10-CM | POA: Insufficient documentation

## 2022-04-22 DIAGNOSIS — I10 Essential (primary) hypertension: Secondary | ICD-10-CM | POA: Insufficient documentation

## 2022-04-22 DIAGNOSIS — J45909 Unspecified asthma, uncomplicated: Secondary | ICD-10-CM | POA: Insufficient documentation

## 2022-04-22 DIAGNOSIS — Z20822 Contact with and (suspected) exposure to covid-19: Secondary | ICD-10-CM | POA: Insufficient documentation

## 2022-04-22 DIAGNOSIS — R109 Unspecified abdominal pain: Secondary | ICD-10-CM | POA: Insufficient documentation

## 2022-04-22 LAB — CBC WITH DIFFERENTIAL/PLATELET
Abs Immature Granulocytes: 0.02 10*3/uL (ref 0.00–0.07)
Basophils Absolute: 0 10*3/uL (ref 0.0–0.1)
Basophils Relative: 1 %
Eosinophils Absolute: 0.2 10*3/uL (ref 0.0–0.5)
Eosinophils Relative: 2 %
HCT: 38.3 % (ref 36.0–46.0)
Hemoglobin: 12.6 g/dL (ref 12.0–15.0)
Immature Granulocytes: 0 %
Lymphocytes Relative: 59 %
Lymphs Abs: 3.7 10*3/uL (ref 0.7–4.0)
MCH: 27.8 pg (ref 26.0–34.0)
MCHC: 32.9 g/dL (ref 30.0–36.0)
MCV: 84.4 fL (ref 80.0–100.0)
Monocytes Absolute: 0.3 10*3/uL (ref 0.1–1.0)
Monocytes Relative: 5 %
Neutro Abs: 2.1 10*3/uL (ref 1.7–7.7)
Neutrophils Relative %: 33 %
Platelets: 311 10*3/uL (ref 150–400)
RBC: 4.54 MIL/uL (ref 3.87–5.11)
RDW: 13.6 % (ref 11.5–15.5)
WBC: 6.3 10*3/uL (ref 4.0–10.5)
nRBC: 0 % (ref 0.0–0.2)

## 2022-04-22 LAB — BASIC METABOLIC PANEL
Anion gap: 8 (ref 5–15)
BUN: 18 mg/dL (ref 6–20)
CO2: 26 mmol/L (ref 22–32)
Calcium: 9 mg/dL (ref 8.9–10.3)
Chloride: 103 mmol/L (ref 98–111)
Creatinine, Ser: 0.86 mg/dL (ref 0.44–1.00)
GFR, Estimated: >60 mL/min (ref >60)
Glucose, Bld: 82 mg/dL (ref 70–99)
Potassium: 3.7 mmol/L (ref 3.5–5.1)
Sodium: 137 mmol/L (ref 135–145)

## 2022-04-22 LAB — SARS CORONAVIRUS 2 BY RT PCR: SARS Coronavirus 2 by RT PCR: NEGATIVE

## 2022-04-22 LAB — URINALYSIS, ROUTINE W REFLEX MICROSCOPIC
Bilirubin Urine: NEGATIVE
Glucose, UA: NEGATIVE mg/dL
Hgb urine dipstick: NEGATIVE
Ketones, ur: NEGATIVE mg/dL
Leukocytes,Ua: NEGATIVE
Nitrite: NEGATIVE
Protein, ur: NEGATIVE mg/dL
Specific Gravity, Urine: 1.024 (ref 1.005–1.030)
pH: 5 (ref 5.0–8.0)

## 2022-04-22 MED ORDER — CYCLOBENZAPRINE HCL 10 MG PO TABS: 10.0000 mg | ORAL_TABLET | Freq: Three times a day (TID) | ORAL | 0 refills | Status: AC | PRN

## 2022-04-22 NOTE — ED Provider Triage Note (Signed)
  Emergency Medicine Provider Triage Evaluation Note  Kristen Ibarra , a 56 y.o.female,  was evaluated in triage.  Pt complains of right-sided flank pain.  Patient states that she has been having right-sided flank pain for the past 3 days, endorsing increased pain whenever she urinates.  Additionally states that her blood pressure has been fluctuating up and down over the past week as well.  She states that she has a history of kidney infections and cyst on her kidneys in the past.  No history of kidney stones.  Additionally reports pain from her sciatica/neuropathy into her right lower extremity.   Review of Systems  Positive: Right-sided flank pain Negative: Denies fever, chest pain, vomiting  Physical Exam   Vitals:   04/22/22 1527 04/22/22 1528  BP:  (!) 149/78  Resp: 16 16  Temp: 98.1 F (36.7 C)    Gen:   Awake, no distress   Resp:  Normal effort  MSK:   Moves extremities without difficulty  Other:  Positive CVAT right side.  Medical Decision Making  Given the patient's initial medical screening exam, the following diagnostic evaluation has been ordered. The patient will be placed in the appropriate treatment space, once one is available, to complete the evaluation and treatment. I have discussed the plan of care with the patient and I have advised the patient that an ED physician or mid-level practitioner will reevaluate their condition after the test results have been received, as the results may give them additional insight into the type of treatment they may need.    Diagnostics: Labs, CT renal, UA.  Treatments: none immediately   Teodoro Spray, [place] 04/22/22 1734

## 2022-04-22 NOTE — Discharge Instructions (Addendum)
-  You may take Tylenol/ibuprofen as needed for pain/discomfort.  You may additionally take cyclobenzaprine, though use caution as it may make you dizzy/drowsy.  If it makes you too drowsy during the day, you can opt to only take it at night before bed.  -Please review the educational material provided.  Recommend the rehabilitation exercises.  -Follow-up with your primary care provider in regards to your blood pressure management, as discussed.  -Return to the emergency department anytime if you begin to experience any new or worsening symptoms.

## 2022-04-22 NOTE — ED Triage Notes (Signed)
C/O hypertension x 2 days and body aches x 2 days.

## 2022-04-22 NOTE — ED Provider Notes (Signed)
[facility] Provider Note    Event Date/Time   First MD Initiated Contact with Patient 04/22/22 1718     (approximate)   History   Chief Complaint Hypertension   HPI Kristen Ibarra is a 56 y.o. female, history of asthma, hypertension, diabetes, depression, diabetic neuropathy, GAD, hyperlipidemia, presents emergency department for evaluation of right-sided flank pain, body aches, hypertension x2 days.  Denies any recent sick contacts.  She states that she feels discomfort in her right flank whenever she urinates.  She does state that she has a history of infections and cysts on her kidneys and is unsure if this is related.  She additionally states that she has a history of sciatic nerve pain as well and thinks this may be contributing.  Denies fever/chills, chest pain, shortness of breath, abdominal pain, nausea/vomiting, diarrhea, hematuria, hematemesis, hematochezia, dizziness/lightheadedness, rash/lesions, or numb/tingling upper or lower extremities.  History Limitations: No limitations..        Physical Exam  Triage Vital Signs: ED Triage Vitals  Enc Vitals Group     BP 04/22/22 1528 (!) 149/78     Pulse --      Resp 04/22/22 1527 16     Temp 04/22/22 1527 98.1 F (36.7 C)     Temp Source 04/22/22 1527 Oral     SpO2 --      Weight 04/22/22 1526 250 lb (113.4 kg)     Height 04/22/22 1526 '5\' 2"'$  (1.575 m)     Head Circumference --      Peak Flow --      Pain Score 04/22/22 1526 0     Pain Loc --      Pain Edu? --      Excl. in [place]? --     Most recent vital signs: Vitals:   04/22/22 1836 04/22/22 1937  BP: (!) 140/80 132/79  Pulse: 80 79  Resp: 16 18  Temp:    SpO2: 98% 96%    General: Awake, NAD.  Skin: Warm, dry. No rashes or lesions.  Eyes: PERRL. Conjunctivae normal.  CV: Good peripheral perfusion.  Resp: Normal effort.  Abd: Soft, non-tender. No distention.  Neuro: At baseline. No gross neurological deficits.   Musculoskeletal: Normal ROM of all extremities.  Focused Exam: Right-sided CVAT.  Positive straight leg test on the lower right side.  Physical Exam    ED Results / Procedures / Treatments  Labs (all labs ordered are listed, but only abnormal results are displayed) Labs Reviewed  URINALYSIS, ROUTINE W REFLEX MICROSCOPIC - Abnormal; Notable for the following components:      Result Value   Color, Urine AMBER (*)    APPearance CLOUDY (*)    All other components within normal limits  SARS CORONAVIRUS 2 BY RT PCR  CBC WITH DIFFERENTIAL/PLATELET  BASIC METABOLIC PANEL     EKG N/A.    RADIOLOGY  ED Provider Interpretation: I personally viewed and interpreted the CT, no evidence of ureterolithiasis.  Left adnexal cyst approximately 3 cm present.  CT Renal Stone Study  Result Date: 04/22/2022 CLINICAL DATA:  Flank pain, body aches EXAM: CT ABDOMEN AND PELVIS WITHOUT CONTRAST TECHNIQUE: Multidetector CT imaging of the abdomen and pelvis was performed following the standard protocol without IV contrast. RADIATION DOSE REDUCTION: This exam was performed according to the departmental dose-optimization program which includes automated exposure control, adjustment of the mA and/or kV according to patient size and/or use of iterative reconstruction technique. COMPARISON:  02/19/2022 FINDINGS:  Lower chest: Lung bases are clear. Hepatobiliary: Unenhanced liver is unremarkable. Gallbladder is unremarkable. No intrahepatic or extrahepatic ductal dilatation. Pancreas: Within normal limits. Spleen: Within normal limits. Adrenals/Urinary Tract: Adrenal glands are within normal limits. Kidneys are within normal limits.  No hydronephrosis. Bladder is within normal limits. Stomach/Bowel: Stomach is within normal limits. No evidence of bowel obstruction. Normal appendix (coronal image 61). Mild sigmoid diverticulosis, without evidence of diverticulitis. Vascular/Lymphatic: No evidence of abdominal aortic  aneurysm. No suspicious abdominopelvic lymphadenopathy. Reproductive: Status post hysterectomy. 3.1 cm simple cyst in the left adnexa (series 2/image 69), unchanged. Right ovary is within normal limits. Other: No abdominopelvic ascites. Musculoskeletal: Mild degenerative changes at L5-S1. IMPRESSION: Mild sigmoid diverticulosis, without evidence of diverticulitis. No CT findings to account for the patient's abdominal pain. 3.1 cm simple left adnexal cyst. Assuming a postmenopausal patient, annual follow-up ultrasound is suggested. Electronically Signed   By: Julian Hy M.D.   On: 04/22/2022 17:58    PROCEDURES:  Critical Care performed: N/A.  Procedures    MEDICATIONS ORDERED IN ED: Medications - No data to display   IMPRESSION / MDM / [place] / ED COURSE  I reviewed the triage vital signs and the nursing notes.                              Differential diagnosis includes, but is not limited to, cystitis, pyelonephritis, ureterolithiasis, sciatica, COVID-19, influenza, hypertension.  ED Course Patient appears well, vitals within normal limits.  NAD.  Her blood pressure is mildly elevated at 149/78.  CBC shows no leukocytosis or anemia.  BMP shows no electrolyte abnormalities or AKI.  Urinalysis shows no evidence of infection.  COVID-19 PCR negative.  Assessment/Plan Patient presents with right side back/flank pain with extension into her right lower extremity.  Lab work-up is reassuring.  Urinalysis shows no evidence of infection.  CT renal stone study does not show any signs of ureterolithiasis.  No signs of pyelonephritis.  Advised patient of the simple cyst that was noted on her left adnexa, which she states she was aware of.  I suspect that her pain is likely musculoskeletal in nature.  She is otherwise clinically stable and appears well.  Provide her with a short-term prescription for cyclobenzaprine.  Advised her that she can opt to only take it at night if  it makes her too drowsy during the day.  Recommended Tylenol/ibuprofen as well.  She is amenable to this plan.  With discharge.  Considered admission for this patient, but given her stable presentation and unremarkable work-up, she is unlikely to benefit from admission.  Provided the patient with anticipatory guidance, return precautions, and educational material. Encouraged the patient to return to the emergency department at any time if they begin to experience any new or worsening symptoms. Patient expressed understanding and agreed with the plan.   Patient's presentation is most consistent with acute complicated illness / injury requiring diagnostic workup.       FINAL CLINICAL IMPRESSION(S) / ED DIAGNOSES   Final diagnoses:  Hypertension, unspecified type  Right-sided low back pain with right-sided sciatica, unspecified chronicity     Rx / DC Orders   ED Discharge Orders          Ordered    cyclobenzaprine (FLEXERIL) 10 MG tablet  3 times daily PRN        04/22/22 1922  Note:  This document was prepared using Dragon voice recognition software and may include unintentional dictation errors.   Teodoro Spray, [place] 04/22/22 2310    Duffy Bruce, MD 04/24/22 [phone]

## 2022-06-13 DIAGNOSIS — Z419 Encounter for procedure for purposes other than remedying health state, unspecified: Secondary | ICD-10-CM | POA: Diagnosis not present

## 2022-07-14 DIAGNOSIS — Z419 Encounter for procedure for purposes other than remedying health state, unspecified: Secondary | ICD-10-CM | POA: Diagnosis not present

## 2022-09-04 ENCOUNTER — Encounter: Payer: Self-pay | Admitting: Nurse Practitioner

## 2022-09-04 ENCOUNTER — Ambulatory Visit (INDEPENDENT_AMBULATORY_CARE_PROVIDER_SITE_OTHER): Payer: Medicaid Other | Admitting: Nurse Practitioner

## 2022-09-04 VITALS — BP 110/70 | HR 70 | Temp 98.1°F | Ht 62.0 in | Wt 236.8 lb

## 2022-09-04 DIAGNOSIS — E1165 Type 2 diabetes mellitus with hyperglycemia: Secondary | ICD-10-CM | POA: Diagnosis not present

## 2022-09-04 DIAGNOSIS — E782 Mixed hyperlipidemia: Secondary | ICD-10-CM

## 2022-09-04 DIAGNOSIS — J4520 Mild intermittent asthma, uncomplicated: Secondary | ICD-10-CM | POA: Diagnosis not present

## 2022-09-04 DIAGNOSIS — F418 Other specified anxiety disorders: Secondary | ICD-10-CM | POA: Diagnosis not present

## 2022-09-04 DIAGNOSIS — I10 Essential (primary) hypertension: Secondary | ICD-10-CM

## 2022-09-04 MED ORDER — HYDROXYZINE HCL 10 MG PO TABS: 10.0000 mg | ORAL_TABLET | Freq: Two times a day (BID) | ORAL | 0 refills | Status: DC | PRN

## 2022-09-04 MED ORDER — ALBUTEROL SULFATE HFA 108 (90 BASE) MCG/ACT IN AERS: 2.0000 | INHALATION_SPRAY | Freq: Four times a day (QID) | RESPIRATORY_TRACT | 2 refills | Status: AC | PRN

## 2022-09-04 NOTE — Patient Instructions (Signed)
Will do labs today. Started on hydroxyzine and albuterol inhaler. Will restart on medication for diabetes after the labs are done.  Referral for colonoscopy and eye doctor

## 2022-09-04 NOTE — Progress Notes (Signed)
New Patient Office Visit  Subjective    Patient ID: Kristen Ibarra, female    DOB: 11-Jun-1966  Age: 57 y.o. MRN: [id number]  CC:  Chief Complaint  Patient presents with   Establish Care    HPI ESHA SIRKIN presents to establish care. She has h/o anxiety, depression, diabetes, hypertension, neuropathy, DDG, IBS, sickle cell trait and asthma.  She is not taking any medication at present. She states that she has not been working since 2021. She is depressed due to financial liabilities and health conditions. Patient was crying during the entire visit.   She also complain of sleeping issues.   She is trying home remedies to take care of health. She has been drinking vinegar to control her diabetes.    Outpatient Encounter Medications as of 09/04/2022  Medication Sig   albuterol (VENTOLIN HFA) 108 (90 Base) MCG/ACT inhaler Inhale 2 puffs into the lungs every 6 (six) hours as needed for wheezing or shortness of breath.   hydrOXYzine (ATARAX) 10 MG tablet Take 1 tablet (10 mg total) by mouth 2 (two) times daily as needed for anxiety.   [DISCONTINUED] albuterol (PROVENTIL) (2.5 MG/3ML) 0.083% nebulizer solution Inhale into the lungs.   [DISCONTINUED] cefdinir (OMNICEF) 300 MG capsule Take 1 capsule (300 mg total) by mouth 2 (two) times daily.   fluticasone (FLONASE) 50 MCG/ACT nasal spray Place into the nose. (Patient not taking: Reported on 09/05/2022)   GABAPENTIN PO Take by mouth. (Patient not taking: Reported on 09/05/2022)   insulin detemir (LEVEMIR) 100 UNIT/ML injection Inject into the skin. (Patient not taking: Reported on 09/05/2022)   Insulin Glargine (LANTUS SOLOSTAR) 100 UNIT/ML Solostar Pen Inject into the skin. (Patient not taking: Reported on 09/05/2022)   Linagliptin (TRADJENTA PO) Take by mouth. (Patient not taking: Reported on 09/05/2022)   Liraglutide (VICTOZA [place]) Inject into the skin. (Patient not taking: Reported on 09/05/2022)   LISINOPRIL PO Take by mouth. (Patient  not taking: Reported on 09/05/2022)   omeprazole (PRILOSEC) 40 MG capsule Take 1 capsule (40 mg total) by mouth daily. (Patient not taking: Reported on 09/05/2022)   ondansetron (ZOFRAN-ODT) 4 MG disintegrating tablet TAKE 1 TABLET BY MOUTH EVERY 8 HOURS AS NEEDED FOR NAUSEA FOR UP TO 7 DAYS (Patient not taking: Reported on 09/05/2022)   No facility-administered encounter medications on file as of 09/04/2022.    Past Medical History:  Diagnosis Date   Asthma    Diabetes mellitus without complication ([place])    Hypertension    Neuropathy     Past Surgical History:  Procedure Laterality Date   ABDOMINAL HYSTERECTOMY     COLONOSCOPY WITH PROPOFOL N/A 10/30/2017   Procedure: COLONOSCOPY WITH PROPOFOL;  Surgeon: [name], MD;  Location: [facility] ENDOSCOPY;  Service: Gastroenterology;  Laterality: N/A;   ESOPHAGOGASTRODUODENOSCOPY (EGD) WITH PROPOFOL N/A 10/30/2017   Procedure: ESOPHAGOGASTRODUODENOSCOPY (EGD) WITH PROPOFOL;  Surgeon: [name], MD;  Location: [facility] ENDOSCOPY;  Service: Gastroenterology;  Laterality: N/A;    History reviewed. No pertinent family history.  Social History   Socioeconomic History   Marital status: Single    Spouse name: Not on file   Number of children: Not on file   Years of education: Not on file   Highest education level: Not on file  Occupational History   Not on file  Tobacco Use   Smoking status: Never   Smokeless tobacco: Never  Vaping Use   Vaping Use: Never used  Substance and Sexual Activity   Alcohol use: Never  Drug use: Never   Sexual activity: Yes  Other Topics Concern   Not on file  Social History Narrative   Not on file   Social Determinants of Health   Financial Resource Strain: Not on file  Food Insecurity: Not on file  Transportation Needs: Not on file  Physical Activity: Not on file  Stress: Not on file  Social Connections: Not on file  Intimate Partner Violence: Not on file    Review of Systems  Constitutional:  Negative.   HENT: Negative.    Eyes:  Positive for blurred vision (off and on).  Cardiovascular:  Negative for chest pain and leg swelling.  Gastrointestinal:  Positive for abdominal pain.  Genitourinary: Negative.   Musculoskeletal:  Positive for back pain.  Neurological:  Negative for headaches.  Psychiatric/Behavioral:  Positive for depression. Negative for substance abuse. The patient is nervous/anxious.         Objective    BP 110/70   Pulse 70   Temp 98.1 F (36.7 C) (Oral)   Ht '5\' 2"'$  (1.575 m)   Wt 236 lb 12.8 oz (107.4 kg)   SpO2 97%   BMI 43.31 kg/m   Physical Exam Vitals reviewed: walks with cane..  Constitutional:      Appearance: She is obese.  HENT:     Head: Normocephalic.     Right Ear: Tympanic membrane normal.     Left Ear: Tympanic membrane normal.     Nose: Nose normal.     Mouth/Throat:     Mouth: Mucous membranes are moist.  Cardiovascular:     Rate and Rhythm: Normal rate and regular rhythm.     Pulses: Normal pulses.     Heart sounds: Normal heart sounds.  Pulmonary:     Effort: Pulmonary effort is normal.     Breath sounds: Normal breath sounds.  Abdominal:     General: Bowel sounds are normal.     Palpations: Abdomen is soft. There is no mass.     Tenderness: There is no abdominal tenderness.  Musculoskeletal:        General: Tenderness (right shoulder) present. No deformity.  Skin:    General: Skin is warm.  Neurological:     General: No focal deficit present.     Mental Status: She is alert and oriented to person, place, and time. Mental status is at baseline.  Psychiatric:        Mood and Affect: Mood normal.        Behavior: Behavior normal. Behavior is cooperative.        Thought Content: Thought content normal.        Judgment: Judgment normal.         Assessment & Plan:   Problem List Items Addressed This Visit       Cardiovascular and Mediastinum   Benign essential hypertension - Primary    Patient BP  Vitals:    09/04/22 1439  BP: 110/70    in the office. Advised pt to follow a low sodium and heart healthy diet. Will continue to monitor.       Relevant Orders   CBC with Differential/Platelet (Completed)   Comprehensive metabolic panel (Completed)   Hemoglobin A1c (Completed)   Lipid panel (Completed)   TSH (Completed)     Respiratory   Asthma    Refilled albuterol inhaler. Continue to monitor.      Relevant Medications   albuterol (VENTOLIN HFA) 108 (90 Base) MCG/ACT inhaler   Other Relevant  Orders   TSH (Completed)     Endocrine   Diabetes ([place])    Not taking anything at present for diabetes. Will check hemoglobin A1c. Advised pt to eat variety of food including fruits, vegetables, whole grains, complex carbohydrates and proteins.  Will continue to monitor and start on medication after the lab results are back.       Relevant Orders   Ambulatory referral to Ophthalmology     Other   Depression with anxiety    PHQ-9  score: 20  and GAD score: 13 Started her on hydroxyzine 10 mg for anxiety and insomnia.       Relevant Medications   hydrOXYzine (ATARAX) 10 MG tablet   Mixed hyperlipidemia    Will check lipid panel.       Return in about 2 weeks (around 09/18/2022).   Theresia Lo, NP

## 2022-09-05 LAB — LIPID PANEL
Cholesterol: 243 mg/dL — ABNORMAL HIGH (ref 0–200)
HDL: 46.4 mg/dL (ref >39.00)
LDL Cholesterol: 167 mg/dL — ABNORMAL HIGH (ref 0–99)
NonHDL: 196.89
Total CHOL/HDL Ratio: 5
Triglycerides: 150 mg/dL — ABNORMAL HIGH (ref 0.0–149.0)
VLDL: 30 mg/dL (ref 0.0–40.0)

## 2022-09-05 LAB — COMPREHENSIVE METABOLIC PANEL
ALT: 18 U/L (ref 0–35)
AST: 17 U/L (ref 0–37)
Albumin: 4.3 g/dL (ref 3.5–5.2)
Alkaline Phosphatase: 62 U/L (ref 39–117)
BUN: 15 mg/dL (ref 6–23)
CO2: 27 mEq/L (ref 19–32)
Calcium: 9.4 mg/dL (ref 8.4–10.5)
Chloride: 103 mEq/L (ref 96–112)
Creatinine, Ser: 0.88 mg/dL (ref 0.40–1.20)
GFR: 74 mL/min (ref >60.00)
Glucose, Bld: 205 mg/dL — ABNORMAL HIGH (ref 70–99)
Potassium: 4.7 mEq/L (ref 3.5–5.1)
Sodium: 139 mEq/L (ref 135–145)
Total Bilirubin: 0.7 mg/dL (ref 0.2–1.2)
Total Protein: 6.4 g/dL (ref 6.0–8.3)

## 2022-09-05 LAB — HEMOGLOBIN A1C: Hgb A1c MFr Bld: 9.1 % — ABNORMAL HIGH (ref 4.6–6.5)

## 2022-09-05 LAB — CBC WITH DIFFERENTIAL/PLATELET
Basophils Absolute: 0.1 10*3/uL (ref 0.0–0.1)
Basophils Relative: 1.1 % (ref 0.0–3.0)
Eosinophils Absolute: 0.1 10*3/uL (ref 0.0–0.7)
Eosinophils Relative: 2.5 % (ref 0.0–5.0)
HCT: 40.4 % (ref 36.0–46.0)
Hemoglobin: 13.4 g/dL (ref 12.0–15.0)
Lymphocytes Relative: 54.4 % — ABNORMAL HIGH (ref 12.0–46.0)
Lymphs Abs: 3.1 10*3/uL (ref 0.7–4.0)
MCHC: 33.1 g/dL (ref 30.0–36.0)
MCV: 85.4 fl (ref 78.0–100.0)
Monocytes Absolute: 0.4 10*3/uL (ref 0.1–1.0)
Monocytes Relative: 6.5 % (ref 3.0–12.0)
Neutro Abs: 2 10*3/uL (ref 1.4–7.7)
Neutrophils Relative %: 35.5 % — ABNORMAL LOW (ref 43.0–77.0)
Platelets: 288 10*3/uL (ref 150.0–400.0)
RBC: 4.73 Mil/uL (ref 3.87–5.11)
RDW: 14.3 % (ref 11.5–15.5)
WBC: 5.7 10*3/uL (ref 4.0–10.5)

## 2022-09-05 LAB — TSH: TSH: 1.07 u[IU]/mL (ref 0.35–5.50)

## 2022-09-05 NOTE — Assessment & Plan Note (Signed)
Refilled albuterol inhaler. Continue to monitor.

## 2022-09-05 NOTE — Assessment & Plan Note (Signed)
PHQ-9  score: 20  and GAD score: 13 Started her on hydroxyzine 10 mg for anxiety and insomnia.

## 2022-09-05 NOTE — Assessment & Plan Note (Signed)
Will check lipid panel.

## 2022-09-05 NOTE — Assessment & Plan Note (Signed)
Patient BP  Vitals:   09/04/22 1439  BP: 110/70    in the office. Advised pt to follow a low sodium and heart healthy diet. Will continue to monitor.

## 2022-09-05 NOTE — Assessment & Plan Note (Addendum)
Not taking anything at present for diabetes. Will check hemoglobin A1c. Advised pt to eat variety of food including fruits, vegetables, whole grains, complex carbohydrates and proteins.  Will continue to monitor and start on medication after the lab results are back.

## 2022-11-26 ENCOUNTER — Telehealth: Payer: Self-pay

## 2022-11-26 NOTE — Telephone Encounter (Signed)
Returned patients phone call in regards to scheduling her colonoscopy.  LVM for pt to return my call.   She was seen by Dr. Tobi Bastos in 2019, then  [facility] in 2020.  Discussed with office manager to inquire if we could schedule her colonoscopy.  I was advised to schedule with a different provider in office.  She will be scheduled with Dr. Servando Snare.  We also do not have a referral in the system for her.  I've contacted her pcp's office to request colonoscopy referral to be sent to our office.  Her last colonoscopy was with Dr. Tobi Bastos 10/30/17 polyps were noted and poor prep.  Thanks,   [place], [place]

## 2022-11-26 NOTE — Telephone Encounter (Signed)
PT left message to schedule colonoscopy please return call

## 2022-11-28 ENCOUNTER — Telehealth: Payer: Self-pay

## 2022-11-28 NOTE — Telephone Encounter (Signed)
Gastroenterology Pre-Procedure Review  Request Date: TBD Requesting Physician: TBD  PATIENT REVIEW QUESTIONS: The patient responded to the following health history questions as indicated:    1. Are you having any GI issues? yes (constipation thinks its related to her back pain, has been an ongoing issue) 2. Do you have a personal history of Polyps? yes (polyps noted on 10/2017 colonoscopy performed by Dr. Tobi Bastos) 3. Do you have a family history of Colon Cancer or Polyps? no 4. Diabetes Mellitus? yes (takes levemir, victoza, trigenta, and jardiance) 5. Joint replacements in the past 12 months?no 6. Major health problems in the past 3 months?no 7. Any artificial heart valves, MVP, or defibrillator?no    MEDICATIONS & ALLERGIES:    Patient reports the following regarding taking any anticoagulation/antiplatelet therapy:   Plavix, Coumadin, Eliquis, Xarelto, Lovenox, Pradaxa, Brilinta, or Effient? no Aspirin? yes (81 mg aspirin)  Patient confirms/reports the following medications:  Current Outpatient Medications  Medication Sig Dispense Refill   albuterol (VENTOLIN HFA) 108 (90 Base) MCG/ACT inhaler Inhale 2 puffs into the lungs every 6 (six) hours as needed for wheezing or shortness of breath. 8 g 2   fluticasone (FLONASE) 50 MCG/ACT nasal spray Place into the nose. (Patient not taking: Reported on 09/05/2022)     GABAPENTIN PO Take by mouth. (Patient not taking: Reported on 09/05/2022)     hydrOXYzine (ATARAX) 10 MG tablet Take 1 tablet (10 mg total) by mouth 2 (two) times daily as needed for anxiety. 30 tablet 0   insulin detemir (LEVEMIR) 100 UNIT/ML injection Inject into the skin. (Patient not taking: Reported on 09/05/2022)     Insulin Glargine (LANTUS SOLOSTAR) 100 UNIT/ML Solostar Pen Inject into the skin. (Patient not taking: Reported on 09/05/2022)     Linagliptin (TRADJENTA PO) Take by mouth. (Patient not taking: Reported on 09/05/2022)     Liraglutide (VICTOZA [place]) Inject into the skin.  (Patient not taking: Reported on 09/05/2022)     LISINOPRIL PO Take by mouth. (Patient not taking: Reported on 09/05/2022)     omeprazole (PRILOSEC) 40 MG capsule Take 1 capsule (40 mg total) by mouth daily. (Patient not taking: Reported on 09/05/2022) 90 capsule 3   ondansetron (ZOFRAN-ODT) 4 MG disintegrating tablet TAKE 1 TABLET BY MOUTH EVERY 8 HOURS AS NEEDED FOR NAUSEA FOR UP TO 7 DAYS (Patient not taking: Reported on 09/05/2022)  0   No current facility-administered medications for this visit.    Patient confirms/reports the following allergies:  No Known Allergies  No orders of the defined types were placed in this encounter.   AUTHORIZATION INFORMATION Primary Insurance: 1D#: Group #:  Secondary Insurance: 1D#: Group #:  SCHEDULE INFORMATION: Date: TBD Time: Location: [facility]

## 2022-12-10 NOTE — Progress Notes (Signed)
Celso Amy, PA-C [address]  [address]  [place], [place] [address]  Main: [phone]  Fax: [phone]   Gastroenterology Consultation  Referring Provider:     Leanna Sato, MD Primary Care Physician:  Leanna Sato, MD Primary Gastroenterologist:  Dr. Wyline Mood / Celso Amy, PA-C  Reason for Consultation:     Constipation        HPI:   Kristen Ibarra is a 57 y.o. y/o female referred for consultation & management  by Leanna Sato, MD.   Has history of IBS, sickle cell trait, neuropathy, hypertension, anxiety, depression, and diabetes.  No current treatment for constipation.  Takes omeprazole 40 Mg once daily for acid reflux.  EGD 10/2017 by Dr. Tobi Bastos showed moderate gastritis, otherwise normal.  Biopsies were positive for mild chronic active H. pylori gastritis.  No dysplasia.  Treated with amoxicillin / clarithromycin / PPI.  Was lost to follow-up after that and has not had any repeat H. pylori test.  Colonoscopy 10/2017 by Dr. Tobi Bastos showed 2 small (3 mm, 5 mm) adenomatous polyps removed from the cecum.  Poor bowel prep.  Repeat colonoscopy was recommended in 6 months, which is overdue.   Past Medical History:  Diagnosis Date   Asthma    Diabetes mellitus without complication (HCC)    Hypertension    Neuropathy     Past Surgical History:  Procedure Laterality Date   ABDOMINAL HYSTERECTOMY     COLONOSCOPY WITH PROPOFOL N/A 10/30/2017   Procedure: COLONOSCOPY WITH PROPOFOL;  Surgeon: Wyline Mood, MD;  Location: [facility] ENDOSCOPY;  Service: Gastroenterology;  Laterality: N/A;   ESOPHAGOGASTRODUODENOSCOPY (EGD) WITH PROPOFOL N/A 10/30/2017   Procedure: ESOPHAGOGASTRODUODENOSCOPY (EGD) WITH PROPOFOL;  Surgeon: Wyline Mood, MD;  Location: [facility] ENDOSCOPY;  Service: Gastroenterology;  Laterality: N/A;    Prior to Admission medications   Medication Sig Start Date End Date Taking? Authorizing Provider  albuterol (VENTOLIN HFA) 108 (90 Base) MCG/ACT inhaler  Inhale 2 puffs into the lungs every 6 (six) hours as needed for wheezing or shortness of breath. 09/04/22   Kara Dies, NP  atorvastatin (LIPITOR) 40 MG tablet Take 40 mg by mouth at bedtime. 09/19/22   [provider]  citalopram (CELEXA) 40 MG tablet SMARTSIG:1 Tablet(s) By Mouth Every Evening 09/19/22   [provider]  D 1000 25 MCG (1000 UT) capsule Take 1,000 Units by mouth daily. 10/01/22   [provider]  fluticasone (FLONASE) 50 MCG/ACT nasal spray Place into the nose. Patient not taking: Reported on 09/05/2022    [provider]  GABAPENTIN PO Take by mouth. Patient not taking: Reported on 09/05/2022    [provider]  hydrOXYzine (ATARAX) 10 MG tablet Take 1 tablet (10 mg total) by mouth 2 (two) times daily as needed for anxiety. 09/04/22   Kara Dies, NP  insulin detemir (LEVEMIR) 100 UNIT/ML injection Inject into the skin. Patient not taking: Reported on 09/05/2022    [provider]  Insulin Glargine (LANTUS SOLOSTAR) 100 UNIT/ML Solostar Pen Inject into the skin. Patient not taking: Reported on 09/05/2022    [provider]  JARDIANCE 25 MG TABS tablet Take 25 mg by mouth daily. 09/19/22   [provider]  Linagliptin (TRADJENTA PO) Take by mouth. Patient not taking: Reported on 09/05/2022    [provider]  Liraglutide (VICTOZA [place]) Inject into the skin. Patient not taking: Reported on 09/05/2022    [provider]  LISINOPRIL PO Take by mouth. Patient  not taking: Reported on 09/05/2022    [provider]  omeprazole (PRILOSEC) 40 MG capsule Take 1 capsule (40 mg total) by mouth daily. Patient not taking: Reported on 09/05/2022 11/11/17   Wyline Mood, MD  ondansetron (ZOFRAN-ODT) 4 MG disintegrating tablet TAKE 1 TABLET BY MOUTH EVERY 8 HOURS AS NEEDED FOR NAUSEA FOR UP TO 7 DAYS Patient not taking: Reported on 09/05/2022 10/11/17   [provider]  oxybutynin (DITROPAN-XL)  10 MG 24 hr tablet Take 10 mg by mouth daily. 09/19/22   [provider]  TRUEPLUS PEN NEEDLES 31G X 8 MM MISC See admin instructions. 09/19/22   [provider]    No family history on file.   Social History   Tobacco Use   Smoking status: Never   Smokeless tobacco: Never  Vaping Use   Vaping Use: Never used  Substance Use Topics   Alcohol use: Never   Drug use: Never    Allergies as of 12/11/2022   (No Known Allergies)    Review of Systems:    All systems reviewed and negative except where noted in HPI.   Physical Exam:  There were no vitals taken for this visit. No LMP recorded. Patient has had a hysterectomy. Psych:  Alert and cooperative. Normal mood and affect. General:   Alert,  Well-developed, well-nourished, pleasant and cooperative in NAD Head:  Normocephalic and atraumatic. Eyes:  Sclera clear, no icterus.   Conjunctiva pink. Neck:  Supple; no masses or thyromegaly. Lungs:  Respirations even and unlabored.  Clear throughout to auscultation.   No wheezes, crackles, or rhonchi. No acute distress. Heart:  Regular rate and rhythm; no murmurs, clicks, rubs, or gallops. Abdomen:  Normal bowel sounds.  No bruits.  Soft, and non-distended without masses, hepatosplenomegaly or hernias noted.  No Tenderness.  No guarding or rebound tenderness.    Neurologic:  Alert and oriented x3;  grossly normal neurologically. Psych:  Alert and cooperative. Normal mood and affect.  Imaging Studies: No results found.  Assessment and Plan:   Kristen Ibarra is a 57 y.o. y/o female has been referred for   Constipation   GERD   History of H. pylori gastritis (found on EGD 10/2017) - Tx Amox / Clarith / PPI.  Scheduling EGD I discussed risks of EGD with patient to include risk of bleeding, perforation, and risk of sedation.   Patient expressed understanding and agrees to proceed with EGD.   History of colon polyps - Colonoscopy 10/2017 had poor prep and 2 small  polyps removed.  Scheduling Colonoscopy I discussed risks of colonoscopy with patient to include risk of bleeding, colon perforation, and risk of sedation.  Patient expressed understanding and agrees to proceed with colonoscopy.   **Extra Prep: 2 Day Liquids; 2 Trilytes  Follow up in ***  Celso Amy, PA-C    BP check ***

## 2022-12-11 ENCOUNTER — Other Ambulatory Visit: Payer: Self-pay

## 2022-12-11 ENCOUNTER — Ambulatory Visit (INDEPENDENT_AMBULATORY_CARE_PROVIDER_SITE_OTHER): Payer: Medicaid Other | Admitting: Physician Assistant

## 2022-12-11 ENCOUNTER — Encounter: Payer: Self-pay | Admitting: Physician Assistant

## 2022-12-11 VITALS — BP 117/63 | HR 69 | Temp 98.2°F | Ht 62.0 in | Wt 238.6 lb

## 2022-12-11 DIAGNOSIS — K5904 Chronic idiopathic constipation: Secondary | ICD-10-CM

## 2022-12-11 DIAGNOSIS — A048 Other specified bacterial intestinal infections: Secondary | ICD-10-CM

## 2022-12-11 DIAGNOSIS — Z860100 Personal history of colon polyps, unspecified: Secondary | ICD-10-CM

## 2022-12-11 DIAGNOSIS — K219 Gastro-esophageal reflux disease without esophagitis: Secondary | ICD-10-CM

## 2022-12-11 MED ORDER — PEG 3350-KCL-NA BICARB-NACL 420 G PO SOLR: 4000.0000 mL | Freq: Once | ORAL | 0 refills | Status: AC

## 2022-12-12 ENCOUNTER — Telehealth: Payer: Self-pay

## 2022-12-12 DIAGNOSIS — K219 Gastro-esophageal reflux disease without esophagitis: Secondary | ICD-10-CM

## 2022-12-12 MED ORDER — FAMOTIDINE 20 MG PO TABS: 20.0000 mg | ORAL_TABLET | Freq: Two times a day (BID) | ORAL | 5 refills | Status: DC

## 2022-12-12 NOTE — Telephone Encounter (Signed)
Patient notified.   Notify patient I just sent prescription for famotidine 20 Mg 1 tablet twice daily for acid reflux to [facility].  I had given her samples of Linzess 145 and 290 to try here in the office.  After she tries those samples, then she can call back for prescription if it works well for constipation.  Celso Amy, PA-C

## 2022-12-12 NOTE — Telephone Encounter (Signed)
Patient calls office at this time-she says medication is not at the pharmacy. Please call medication to pharmacy.

## 2022-12-13 LAB — H. PYLORI BREATH TEST: H pylori Breath Test: NEGATIVE

## 2022-12-15 ENCOUNTER — Telehealth: Payer: Self-pay

## 2022-12-15 NOTE — Progress Notes (Signed)
Notify patient H. pylori breath test is negative.  No further treatment is needed.  Continue with plan for EGD and colonoscopy as scheduled.

## 2022-12-15 NOTE — Telephone Encounter (Signed)
Left detailed message on VM for patient.   Notify patient H. pylori breath test is negative.  No further treatment is needed.  Continue with plan for EGD and colonoscopy as scheduled.

## 2022-12-18 ENCOUNTER — Telehealth: Payer: Self-pay

## 2022-12-18 NOTE — Telephone Encounter (Signed)
Patient notified.   1.  Stop famotidine (Pepcid).  2.  Take OTC Tums or Prilosec as needed for acid reflux symptoms.  3.  Avoid GERD trigger foods and drinks.  4.  Recent H. pylori breath test was negative.  She does not need any antibiotic treatment.  Celso Amy, PA-C

## 2022-12-18 NOTE — Telephone Encounter (Signed)
Patient left message on VM-( famotidine ) is causing hallucinations and is not comfortable with taking. She also is questioning an antibiotic that was discussed during office visit. Please advise.

## 2023-01-08 ENCOUNTER — Other Ambulatory Visit: Payer: Self-pay | Admitting: Family Medicine

## 2023-01-08 DIAGNOSIS — Z1231 Encounter for screening mammogram for malignant neoplasm of breast: Secondary | ICD-10-CM

## 2023-01-20 ENCOUNTER — Telehealth: Payer: Self-pay

## 2023-01-20 ENCOUNTER — Telehealth: Payer: Self-pay | Admitting: Physician Assistant

## 2023-01-20 MED ORDER — LINACLOTIDE 290 MCG PO CAPS: 290.0000 ug | ORAL_CAPSULE | Freq: Every day | ORAL | 5 refills | Status: AC

## 2023-01-20 NOTE — Telephone Encounter (Signed)
Linzess 290 #[phone] refills.   Patient notified.

## 2023-01-20 NOTE — Telephone Encounter (Signed)
Pt was given samples of linzess 290 mcg she would like a prescription called into [facility]

## 2023-01-28 ENCOUNTER — Encounter: Payer: Self-pay | Admitting: Gastroenterology

## 2023-02-03 ENCOUNTER — Encounter: Payer: Self-pay | Admitting: Gastroenterology

## 2023-02-04 ENCOUNTER — Ambulatory Visit
Admission: RE | Admit: 2023-02-04 | Discharge: 2023-02-04 | Disposition: A | Discharge status: Home / Self Care | Payer: Medicaid Other | Attending: Gastroenterology | Admitting: Gastroenterology

## 2023-02-04 ENCOUNTER — Ambulatory Visit: Payer: Medicaid Other

## 2023-02-04 ENCOUNTER — Encounter
Admission: RE | Disposition: A | Discharge status: Home / Self Care | Payer: Self-pay | Source: Home / Self Care | Attending: Gastroenterology

## 2023-02-04 ENCOUNTER — Encounter: Payer: Self-pay | Admitting: Gastroenterology

## 2023-02-04 ENCOUNTER — Other Ambulatory Visit: Payer: Self-pay

## 2023-02-04 DIAGNOSIS — E119 Type 2 diabetes mellitus without complications: Secondary | ICD-10-CM | POA: Insufficient documentation

## 2023-02-04 DIAGNOSIS — I10 Essential (primary) hypertension: Secondary | ICD-10-CM | POA: Insufficient documentation

## 2023-02-04 DIAGNOSIS — Z6841 Body Mass Index (BMI) 40.0 and over, adult: Secondary | ICD-10-CM | POA: Insufficient documentation

## 2023-02-04 DIAGNOSIS — J45909 Unspecified asthma, uncomplicated: Secondary | ICD-10-CM | POA: Insufficient documentation

## 2023-02-04 DIAGNOSIS — Z1211 Encounter for screening for malignant neoplasm of colon: Secondary | ICD-10-CM | POA: Diagnosis not present

## 2023-02-04 DIAGNOSIS — K5904 Chronic idiopathic constipation: Secondary | ICD-10-CM

## 2023-02-04 DIAGNOSIS — Z90710 Acquired absence of both cervix and uterus: Secondary | ICD-10-CM | POA: Diagnosis not present

## 2023-02-04 DIAGNOSIS — Z860100 Personal history of colon polyps, unspecified: Secondary | ICD-10-CM

## 2023-02-04 DIAGNOSIS — A048 Other specified bacterial intestinal infections: Secondary | ICD-10-CM

## 2023-02-04 DIAGNOSIS — K219 Gastro-esophageal reflux disease without esophagitis: Secondary | ICD-10-CM | POA: Diagnosis not present

## 2023-02-04 HISTORY — PX: ESOPHAGOGASTRODUODENOSCOPY (EGD) WITH PROPOFOL: SHX5813

## 2023-02-04 HISTORY — PX: COLONOSCOPY WITH PROPOFOL: SHX5780

## 2023-02-04 LAB — GLUCOSE, CAPILLARY: Glucose-Capillary: 91 mg/dL (ref 70–99)

## 2023-02-04 SURGERY — COLONOSCOPY WITH PROPOFOL
Anesthesia: General

## 2023-02-04 MED ORDER — GLYCOPYRROLATE 0.2 MG/ML IJ SOLN
INTRAMUSCULAR | Status: AC
  Filled 2023-02-04: qty 1

## 2023-02-04 MED ORDER — PROPOFOL 500 MG/50ML IV EMUL
INTRAVENOUS | Status: DC | PRN
  Administered 2023-02-04: 150 ug/kg/min via INTRAVENOUS

## 2023-02-04 MED ORDER — PROPOFOL 10 MG/ML IV BOLUS
INTRAVENOUS | Status: AC
  Filled 2023-02-04: qty 40

## 2023-02-04 MED ORDER — LIDOCAINE HCL (CARDIAC) PF 100 MG/5ML IV SOSY
PREFILLED_SYRINGE | INTRAVENOUS | Status: DC | PRN
  Administered 2023-02-04: 60 mg via INTRAVENOUS

## 2023-02-04 MED ORDER — GLYCOPYRROLATE 0.2 MG/ML IJ SOLN
INTRAMUSCULAR | Status: DC | PRN
  Administered 2023-02-04: .2 mg via INTRAVENOUS

## 2023-02-04 MED ORDER — LIDOCAINE HCL (PF) 2 % IJ SOLN
INTRAMUSCULAR | Status: AC
  Filled 2023-02-04: qty 5

## 2023-02-04 MED ORDER — PROPOFOL 10 MG/ML IV BOLUS
INTRAVENOUS | Status: DC | PRN
  Administered 2023-02-04: 70 mg via INTRAVENOUS

## 2023-02-04 MED ORDER — SODIUM CHLORIDE 0.9 % IV SOLN: INTRAVENOUS | Status: DC

## 2023-02-04 NOTE — Anesthesia Preprocedure Evaluation (Signed)
Anesthesia Evaluation  Patient identified by MRN, date of birth, ID band Patient awake    Reviewed: Allergy & Precautions, NPO status , Patient's Chart, lab work & pertinent test results, reviewed documented beta blocker date and time   Airway Mallampati: III  TM Distance: >3 FB Neck ROM: full    Dental  (+) Chipped, Dental Advidsory Given   Pulmonary neg pulmonary ROS, asthma    Pulmonary exam normal        Cardiovascular hypertension, negative cardio ROS Normal cardiovascular exam     Neuro/Psych  PSYCHIATRIC DISORDERS      negative neurological ROS  negative psych ROS   GI/Hepatic negative GI ROS, Neg liver ROS,,,  Endo/Other  negative endocrine ROSdiabetes, Type 2  Morbid obesity  Renal/GU negative Renal ROS  negative genitourinary   Musculoskeletal   Abdominal   Peds  Hematology negative hematology ROS (+)   Anesthesia Other Findings Past Medical History: No date: Asthma No date: Diabetes mellitus without complication (HCC) No date: Hypertension No date: Neuropathy  Past Surgical History: No date: ABDOMINAL HYSTERECTOMY 10/30/2017: COLONOSCOPY WITH PROPOFOL; N/A     Comment:  Procedure: COLONOSCOPY WITH PROPOFOL;  Surgeon: Wyline Mood, MD;  Location: [facility] ENDOSCOPY;  Service:               Gastroenterology;  Laterality: N/A; 10/30/2017: ESOPHAGOGASTRODUODENOSCOPY (EGD) WITH PROPOFOL; N/A     Comment:  Procedure: ESOPHAGOGASTRODUODENOSCOPY (EGD) WITH               PROPOFOL;  Surgeon: Wyline Mood, MD;  Location: [facility]               ENDOSCOPY;  Service: Gastroenterology;  Laterality: N/A;  BMI    Body Mass Index: 42.55 kg/m      Reproductive/Obstetrics negative OB ROS                             Anesthesia Physical Anesthesia Plan  ASA: 3  Anesthesia Plan: General   Post-op Pain Management: Minimal or no pain anticipated   Induction: Intravenous  PONV Risk  Score and Plan: 3 and Propofol infusion, TIVA and Ondansetron  Airway Management Planned: Nasal Cannula  Additional Equipment: None  Intra-op Plan:   Post-operative Plan:   Informed Consent: I have reviewed the patients History and Physical, chart, labs and discussed the procedure including the risks, benefits and alternatives for the proposed anesthesia with the patient or authorized representative who has indicated his/her understanding and acceptance.     Dental advisory given  Plan Discussed with: CRNA and Surgeon  Anesthesia Plan Comments: (Discussed risks of anesthesia with patient, including possibility of difficulty with spontaneous ventilation under anesthesia necessitating airway intervention, PONV, and rare risks such as cardiac or respiratory or neurological events, and allergic reactions. Discussed the role of CRNA in patient's perioperative care. Patient understands.)       Anesthesia Quick Evaluation

## 2023-02-04 NOTE — Op Note (Signed)
[facility] Gastroenterology Patient Name: Armanii Urbanik Procedure Date: 02/04/2023 11:49 AM MRN: [id number] Account #: [id number] Date of Birth: October 25, 1965 Admit Type: Outpatient Age: 57 Room: [facility] ENDO ROOM 2 Gender: Female Note Status: Finalized Instrument Name: Prentice Docker [id number] Procedure:             Colonoscopy Indications:           Screening for colorectal malignant neoplasm Providers:             Wyline Mood MD, MD Referring MD:          Leanna Sato, MD (Referring MD) Medicines:             Monitored Anesthesia Care Complications:         No immediate complications. Procedure:             Pre-Anesthesia Assessment:                        - Prior to the procedure, a History and Physical was                         performed, and patient medications, allergies and                         sensitivities were reviewed. The patient's tolerance                         of previous anesthesia was reviewed.                        - The risks and benefits of the procedure and the                         sedation options and risks were discussed with the                         patient. All questions were answered and informed                         consent was obtained.                        - ASA Grade Assessment: II - A patient with mild                         systemic disease.                        After obtaining informed consent, the colonoscope was                         passed under direct vision. Throughout the procedure,                         the patient's blood pressure, pulse, and oxygen                         saturations were monitored continuously. The                         Colonoscope was  introduced through the anus and                         advanced to the the cecum, identified by the                         appendiceal orifice. The colonoscopy was performed                         with ease. The patient tolerated the procedure  well.                         The quality of the bowel preparation was excellent.                         The ileocecal valve, appendiceal orifice, and rectum                         were photographed. Findings:      The perianal and digital rectal examinations were normal.      The entire examined colon appeared normal on direct and retroflexion       views. Impression:            - The entire examined colon is normal on direct and                         retroflexion views.                        - No specimens collected. Recommendation:        - Discharge patient to home (with escort).                        - Resume previous diet.                        - Continue present medications.                        - Repeat colonoscopy in 10 years for screening                         purposes. Procedure Code(s):     --- Professional ---                        [phone], Colonoscopy, flexible; diagnostic, including                         collection of specimen(s) by brushing or washing, when                         performed (separate procedure) Diagnosis Code(s):     --- Professional ---                        Z12.11, Encounter for screening for malignant neoplasm                         of colon CPT copyright 2022 American Medical Association. All rights reserved. The codes documented in this  report are preliminary and upon coder review may  be revised to meet current compliance requirements. Wyline Mood, MD Wyline Mood MD, MD 02/04/2023 12:53:56 PM This report has been signed electronically. Number of Addenda: 0 Note Initiated On: 02/04/2023 11:49 AM Scope Withdrawal Time: 0 hours 6 minutes 2 seconds  Total Procedure Duration: 0 hours 7 minutes 37 seconds  Estimated Blood Loss:  Estimated blood loss: none.      [facility]

## 2023-02-04 NOTE — Anesthesia Postprocedure Evaluation (Signed)
Anesthesia Post Note  Patient: LASHARA UREY  Procedure(s) Performed: COLONOSCOPY WITH PROPOFOL ESOPHAGOGASTRODUODENOSCOPY (EGD) WITH PROPOFOL  Patient location during evaluation: Endoscopy Anesthesia Type: General Level of consciousness: awake and alert Pain management: pain level controlled Vital Signs Assessment: post-procedure vital signs reviewed and stable Respiratory status: spontaneous breathing, nonlabored ventilation, respiratory function stable and patient connected to nasal cannula oxygen Cardiovascular status: blood pressure returned to baseline and stable Postop Assessment: no apparent nausea or vomiting Anesthetic complications: no  No notable events documented.   Last Vitals:  Vitals:   02/04/23 1314 02/04/23 1324  BP: (!) 116/92 133/78  Pulse: 76   Resp: 18   Temp:    SpO2: 100% 100%    Last Pain:  Vitals:   02/04/23 1314  TempSrc:   PainSc: 0-No pain                 Stephanie Coup

## 2023-02-04 NOTE — H&P (Signed)
Wyline Mood, MD [address], [place], [place], [address] [address], [place], [place], [address] Phone: [phone]  Fax: [phone]  Primary Care Physician:  Leanna Sato, MD   Pre-Procedure History & Physical: HPI:  Kristen Ibarra is a 57 y.o. female is here for an endoscopy and colonoscopy    Past Medical History:  Diagnosis Date   Asthma    Diabetes mellitus without complication (HCC)    Hypertension    Neuropathy     Past Surgical History:  Procedure Laterality Date   ABDOMINAL HYSTERECTOMY     COLONOSCOPY WITH PROPOFOL N/A 10/30/2017   Procedure: COLONOSCOPY WITH PROPOFOL;  Surgeon: Wyline Mood, MD;  Location: [facility] ENDOSCOPY;  Service: Gastroenterology;  Laterality: N/A;   ESOPHAGOGASTRODUODENOSCOPY (EGD) WITH PROPOFOL N/A 10/30/2017   Procedure: ESOPHAGOGASTRODUODENOSCOPY (EGD) WITH PROPOFOL;  Surgeon: Wyline Mood, MD;  Location: [facility] ENDOSCOPY;  Service: Gastroenterology;  Laterality: N/A;    Prior to Admission medications   Medication Sig Start Date End Date Taking? Authorizing Provider  albuterol (VENTOLIN HFA) 108 (90 Base) MCG/ACT inhaler Inhale 2 puffs into the lungs every 6 (six) hours as needed for wheezing or shortness of breath. 09/04/22  Yes Kara Dies, NP  insulin detemir (LEVEMIR) 100 UNIT/ML injection Inject into the skin.   Yes [provider]  LISINOPRIL PO Take by mouth.   Yes [provider]  atorvastatin (LIPITOR) 40 MG tablet Take 40 mg by mouth at bedtime. 09/19/22   [provider]  citalopram (CELEXA) 40 MG tablet SMARTSIG:1 Tablet(s) By Mouth Every Evening 09/19/22   [provider]  D 1000 25 MCG (1000 UT) capsule Take 1,000 Units by mouth daily. 10/01/22   [provider]  famotidine (PEPCID) 20 MG tablet Take 1 tablet (20 mg total) by mouth 2 (two) times daily. 12/12/22 06/10/23  Celso Amy, PA-C  fluticasone (FLONASE) 50 MCG/ACT nasal spray Place into the nose.     [provider]  GABAPENTIN PO Take by mouth.    [provider]  hydrOXYzine (ATARAX) 10 MG tablet Take 1 tablet (10 mg total) by mouth 2 (two) times daily as needed for anxiety. 09/04/22   Kara Dies, NP  Insulin Glargine (LANTUS SOLOSTAR) 100 UNIT/ML Solostar Pen Inject into the skin. Patient not taking: Reported on 02/04/2023    [provider]  JARDIANCE 25 MG TABS tablet Take 25 mg by mouth daily. 09/19/22   [provider]  linaclotide Karlene Einstein) 290 MCG CAPS capsule Take 1 capsule (290 mcg total) by mouth daily before breakfast. 01/20/23   Celso Amy, PA-C  Linagliptin (TRADJENTA PO) Take by mouth.    [provider]  Liraglutide (VICTOZA [place]) Inject into the skin.    [provider]  omeprazole (PRILOSEC) 40 MG capsule Take 1 capsule (40 mg total) by mouth daily. 11/11/17   Wyline Mood, MD  ondansetron (ZOFRAN-ODT) 4 MG disintegrating tablet  10/11/17   [provider]  oxybutynin (DITROPAN-XL) 10 MG 24 hr tablet Take 10 mg by mouth daily. 09/19/22   [provider]  TRUEPLUS PEN NEEDLES 31G X 8 MM MISC See admin instructions. 09/19/22   [provider]    Allergies as of 12/11/2022   (No Known Allergies)    History reviewed. No pertinent family history.  Social History   Socioeconomic History   Marital status: Single    Spouse name: Not on file   Number of children: Not on file  Years of education: Not on file   Highest education level: Not on file  Occupational History   Not on file  Tobacco Use   Smoking status: Never   Smokeless tobacco: Never  Vaping Use   Vaping status: Never Used  Substance and Sexual Activity   Alcohol use: Never   Drug use: Never   Sexual activity: Yes  Other Topics Concern   Not on file  Social History Narrative   Not on file   Social Determinants of Health   Financial Resource Strain: Not on file  Food Insecurity: Not on file  Transportation Needs: Not  on file  Physical Activity: Not on file  Stress: Not on file  Social Connections: Not on file  Intimate Partner Violence: Not on file    Review of Systems: See HPI, otherwise negative ROS  Physical Exam: BP (!) 162/88   Pulse 78   Temp (!) 96.5 F (35.8 C) (Temporal)   Resp 18   Ht 5' 2.5" (1.588 m)   Wt 107.2 kg   SpO2 100%   BMI 42.55 kg/m  General:   Alert,  pleasant and cooperative in NAD Head:  Normocephalic and atraumatic. Neck:  Supple; no masses or thyromegaly. Lungs:  Clear throughout to auscultation, normal respiratory effort.    Heart:  +S1, +S2, Regular rate and rhythm, No edema. Abdomen:  Soft, nontender and nondistended. Normal bowel sounds, without guarding, and without rebound.   Neurologic:  Alert and  oriented x4;  grossly normal neurologically.  Impression/Plan: Kristen Ibarra is here for an endoscopy and colonoscopy  to be performed for  evaluation of GERD and colon cancer screening     Risks, benefits, limitations, and alternatives regarding endoscopy have been reviewed with the patient.  Questions have been answered.  All parties agreeable.   Wyline Mood, MD  02/04/2023, 12:24 PM

## 2023-02-04 NOTE — Op Note (Signed)
[facility] Gastroenterology Patient Name: Kristen Ibarra Procedure Date: 02/04/2023 11:49 AM MRN: [id number] Account #: [id number] Date of Birth: 23-Sep-1965 Admit Type: Outpatient Age: 57 Room: [facility] ENDO ROOM 2 Gender: Female Note Status: Finalized Instrument Name: Upper Endoscope [id number] Procedure:             Upper GI endoscopy Indications:           Follow-up of gastro-esophageal reflux disease Providers:             Wyline Mood MD, MD Referring MD:          Leanna Sato, MD (Referring MD) Medicines:             Monitored Anesthesia Care Complications:         No immediate complications. Procedure:             Pre-Anesthesia Assessment:                        - Prior to the procedure, a History and Physical was                         performed, and patient medications, allergies and                         sensitivities were reviewed. The patient's tolerance                         of previous anesthesia was reviewed.                        - The risks and benefits of the procedure and the                         sedation options and risks were discussed with the                         patient. All questions were answered and informed                         consent was obtained.                        - ASA Grade Assessment: II - A patient with mild                         systemic disease.                        After obtaining informed consent, the endoscope was                         passed under direct vision. Throughout the procedure,                         the patient's blood pressure, pulse, and oxygen                         saturations were monitored continuously. The Endoscope  was introduced through the mouth, and advanced to the                         third part of duodenum. The upper GI endoscopy was                         accomplished with ease. The patient tolerated the                         procedure  well. Findings:      The esophagus was normal.      The stomach was normal.      The examined duodenum was normal. Impression:            - Normal esophagus.                        - Normal stomach.                        - Normal examined duodenum.                        - No specimens collected. Recommendation:        - Perform a colonoscopy today. Procedure Code(s):     --- Professional ---                        [phone], Esophagogastroduodenoscopy, flexible,                         transoral; diagnostic, including collection of                         specimen(s) by brushing or washing, when performed                         (separate procedure) Diagnosis Code(s):     --- Professional ---                        K21.9, Gastro-esophageal reflux disease without                         esophagitis CPT copyright 2022 American Medical Association. All rights reserved. The codes documented in this report are preliminary and upon coder review may  be revised to meet current compliance requirements. Wyline Mood, MD Wyline Mood MD, MD 02/04/2023 12:42:27 PM This report has been signed electronically. Number of Addenda: 0 Note Initiated On: 02/04/2023 11:49 AM Estimated Blood Loss:  Estimated blood loss: none.      [facility]

## 2023-02-04 NOTE — Transfer of Care (Signed)
Immediate Anesthesia Transfer of Care Note  Patient: Kristen Ibarra  Procedure(s) Performed: COLONOSCOPY WITH PROPOFOL ESOPHAGOGASTRODUODENOSCOPY (EGD) WITH PROPOFOL  Patient Location: PACU  Anesthesia Type:General  Level of Consciousness: drowsy  Airway & Oxygen Therapy: Patient Spontanous Breathing and Patient connected to nasal cannula oxygen  Post-op Assessment: Report given to RN and Post -op Vital signs reviewed and stable  Post vital signs: stable  Last Vitals:  Vitals Value Taken Time  BP    Temp    Pulse 81 02/04/23 1254  Resp 14 02/04/23 1254  SpO2 98 % 02/04/23 1254  Vitals shown include unfiled device data.  Last Pain:  Vitals:   02/04/23 1208  TempSrc: Temporal  PainSc: 0-No pain         Complications: No notable events documented.

## 2023-02-05 ENCOUNTER — Encounter: Payer: Self-pay | Admitting: Gastroenterology

## 2023-02-17 NOTE — Progress Notes (Signed)
Celso Amy, PA-C [address]  [address]  [place], [place] [address]  Main: [phone]  Fax: [phone]   Primary Care Physician: Leanna Sato, MD  Primary Gastroenterologist:  Celso Amy, PA-C / Dr. Wyline Mood    CC: F/U Constipation, GERD, H. Pylori, Colon Polyps  HPI: Kristen Ibarra is a 57 y.o. female returns for 84-month follow-up of chronic constipation, GERD, H. pylori gastritis, and history of adenomatous colon polyps.  She tried samples of Linzess 145 and 290 for constipation.  She tried OTC Pepcid 20 mg twice daily for acid reflux.  Currently patient is taking Linzess to 90 mcg 1 capsule once daily with great benefit.  Constipation has greatly improved.  She has had chronic lifelong constipation since childhood.  Linzess is working well.  She is having bowel movement daily with no hard stools or straining.  She denies abdominal pain or diarrhea.  No GI concerns today.  12/11/2022: H. pylori breath test negative. 02/04/2023: EGD - Normal 02/04/2023: Colonoscopy - Excellent prep, No polyps, 10-year repeat  -EGD 10/2017: H. pylori gastritis; tx Amox/Claritin/PPI -Colonoscopy 10/2017: Poor prep, 2 small adenomatous polyps  Current Outpatient Medications  Medication Sig Dispense Refill   albuterol (VENTOLIN HFA) 108 (90 Base) MCG/ACT inhaler Inhale 2 puffs into the lungs every 6 (six) hours as needed for wheezing or shortness of breath. 8 g 2   atorvastatin (LIPITOR) 40 MG tablet Take 40 mg by mouth at bedtime.     citalopram (CELEXA) 40 MG tablet SMARTSIG:1 Tablet(s) By Mouth Every Evening     D 1000 25 MCG (1000 UT) capsule Take 1,000 Units by mouth daily.     famotidine (PEPCID) 20 MG tablet Take 1 tablet (20 mg total) by mouth 2 (two) times daily. 60 tablet 5   fluticasone (FLONASE) 50 MCG/ACT nasal spray Place into the nose.     GABAPENTIN PO Take by mouth.     hydrOXYzine (ATARAX) 10 MG tablet Take 1 tablet (10 mg total) by mouth 2 (two) times daily as  needed for anxiety. 30 tablet 0   insulin detemir (LEVEMIR) 100 UNIT/ML injection Inject into the skin.     JARDIANCE 25 MG TABS tablet Take 25 mg by mouth daily.     linaclotide (LINZESS) 290 MCG CAPS capsule Take 1 capsule (290 mcg total) by mouth daily before breakfast. 30 capsule 5   Liraglutide (VICTOZA [place]) Inject into the skin.     LISINOPRIL PO Take by mouth.     omeprazole (PRILOSEC) 40 MG capsule Take 1 capsule (40 mg total) by mouth daily. 90 capsule 3   ondansetron (ZOFRAN-ODT) 4 MG disintegrating tablet   0   oxybutynin (DITROPAN-XL) 10 MG 24 hr tablet Take 10 mg by mouth daily.     TRUEPLUS PEN NEEDLES 31G X 8 MM MISC See admin instructions.     No current facility-administered medications for this visit.    Allergies as of 02/18/2023   (No Known Allergies)    Past Medical History:  Diagnosis Date   Asthma    Diabetes mellitus without complication (HCC)    Hypertension    Neuropathy     Past Surgical History:  Procedure Laterality Date   ABDOMINAL HYSTERECTOMY     COLONOSCOPY WITH PROPOFOL N/A 10/30/2017   Procedure: COLONOSCOPY WITH PROPOFOL;  Surgeon: Wyline Mood, MD;  Location: [facility] ENDOSCOPY;  Service: Gastroenterology;  Laterality: N/A;   COLONOSCOPY WITH PROPOFOL N/A 02/04/2023   Procedure: COLONOSCOPY WITH PROPOFOL;  Surgeon: Wyline Mood, MD;  Location: [facility] ENDOSCOPY;  Service: Gastroenterology;  Laterality: N/A;   ESOPHAGOGASTRODUODENOSCOPY (EGD) WITH PROPOFOL N/A 10/30/2017   Procedure: ESOPHAGOGASTRODUODENOSCOPY (EGD) WITH PROPOFOL;  Surgeon: Wyline Mood, MD;  Location: [facility] ENDOSCOPY;  Service: Gastroenterology;  Laterality: N/A;   ESOPHAGOGASTRODUODENOSCOPY (EGD) WITH PROPOFOL N/A 02/04/2023   Procedure: ESOPHAGOGASTRODUODENOSCOPY (EGD) WITH PROPOFOL;  Surgeon: Wyline Mood, MD;  Location: [facility] ENDOSCOPY;  Service: Gastroenterology;  Laterality: N/A;    Review of Systems:    All systems reviewed and negative except where noted in HPI.   Physical  Examination:   BP 112/74   Pulse 72   Temp 98 F (36.7 C)   Ht 5' 2.5" (1.588 m)   Wt 235 lb (106.6 kg)   BMI 42.30 kg/m   General: Well-nourished, well-developed in no acute distress.  Eyes: No icterus. Conjunctivae pink. Neuro: Alert and oriented x 3.  Grossly intact.  Walks with a Medical laboratory scientific officer. Skin: Warm and dry, no jaundice.   Psych: Alert and cooperative, normal mood and affect.   Imaging Studies: No results found.  Assessment and Plan:   Kristen Ibarra is a 57 y.o. y/o female returns for follow-up of Chronic Lifelong Constipation, GERD,  Hx H. Pylori.  Chronic constipation is under good control on Linzess to 90 mcg once daily.  GERD is controlled on Pepcid.  H. pylori was treated in 2019 with amoxicillin/clarithromycin/PPI.  Recent H. pylori breath test was negative confirming eradication.  Recent EGD and colonoscopy were normal.  10-year repeat colonoscopy recommended.  1.  Chronic constipation -controlled on Linzess 290  Continue Linzess to 90 mcg once daily.  2.  GERD -controlled on Pepcid 20 Mg twice daily.  3.  Colon cancer screening  10-year repeat screening colonoscopy will be due 01/2033 in the absence of new GI symptoms.  Celso Amy, PA-C  Follow up in 1 year or sooner if she has recurrent GI symptoms.

## 2023-02-18 ENCOUNTER — Ambulatory Visit (INDEPENDENT_AMBULATORY_CARE_PROVIDER_SITE_OTHER): Payer: Medicaid Other | Admitting: Physician Assistant

## 2023-02-18 ENCOUNTER — Encounter: Payer: Self-pay | Admitting: Physician Assistant

## 2023-02-18 VITALS — BP 112/74 | HR 72 | Temp 98.0°F | Ht 62.5 in | Wt 235.0 lb

## 2023-02-18 DIAGNOSIS — K5904 Chronic idiopathic constipation: Secondary | ICD-10-CM

## 2023-02-26 ENCOUNTER — Ambulatory Visit
Admission: RE | Admit: 2023-02-26 | Discharge: 2023-02-26 | Disposition: A | Discharge status: Home / Self Care | Payer: Medicaid Other | Source: Ambulatory Visit | Attending: Family Medicine | Admitting: Family Medicine

## 2023-02-26 DIAGNOSIS — Z1231 Encounter for screening mammogram for malignant neoplasm of breast: Secondary | ICD-10-CM | POA: Insufficient documentation

## 2023-09-14 ENCOUNTER — Ambulatory Visit: Admitting: Internal Medicine

## 2023-09-16 NOTE — Progress Notes (Signed)
 No show

## 2023-12-11 NOTE — Progress Notes (Signed)
 Sleep Medicine   Office Visit  Patient Name: Kristen Ibarra DOB: 06-02-1966 MRN [id number]    Chief Complaint: history of OSA   Brief History:  [name] presents for an initial consult for sleep evaluation and to establish care. Patient has a longstanding history of sleep apnea but has not used a PAP since testing. Since then, patient reports losing 40 lbs but sleep quality is still poor. This is noted every night. The patient's bed partner reports snoring, gasping, and choking at night. The patient relates the following symptoms: fatigue, trouble concentrating and brain fog are also present. The patient reports struggling to initiate sleep. Patient goes to sleep between 0100 and 0200 am and wakes up at about 2-3 hours later. Patient reports struggling to stay asleep after waking up. Sleep quality is better when outside home environment.  Patient has noted no significant movement of her legs at night that would disrupt her sleep.  The patient  relates no unusual behavior during the night.  The patient relates  depression and anxiety as a history of psychiatric problems. The Epworth Sleepiness Score is 7 out of 24 .  The patient relates  Cardiovascular risk factors include: HTN.     ROS  General: (-) fever, (-) chills, (-) night sweat Nose and Sinuses: (-) nasal stuffiness or itchiness, (-) postnasal drip, (-) nosebleeds, (-) sinus trouble. Mouth and Throat: (-) sore throat, (-) hoarseness. Neck: (-) swollen glands, (-) enlarged thyroid , (-) neck pain. Respiratory: -cough, +shortness of breath, - wheezing. Neurologic: + numbness, + tingling due to neuropathy Psychiatric: + anxiety, + depression Sleep behavior: -sleep paralysis -hypnogogic hallucinations -dream enactment      -vivid dreams -cataplexy -night terrors -sleep walking   Current Medication: Outpatient Encounter Medications as of 12/14/2023  Medication Sig   cholecalciferol (VITAMIN D3) 25 MCG (1000 UNIT) tablet Take 1,000  Units by mouth daily.   gabapentin (NEURONTIN) 300 MG capsule Take 300 mg by mouth 3 (three) times daily.   ibuprofen  (ADVIL ) 800 MG tablet Take 800 mg by mouth 3 (three) times daily.   LANTUS SOLOSTAR 100 UNIT/ML Solostar Pen Inject into the skin.   lisinopril (ZESTRIL) 5 MG tablet Take 5 mg by mouth daily.   tiZANidine (ZANAFLEX) 4 MG tablet Take 4 mg by mouth at bedtime.   albuterol  (VENTOLIN  HFA) 108 (90 Base) MCG/ACT inhaler Inhale 2 puffs into the lungs every 6 (six) hours as needed for wheezing or shortness of breath.   atorvastatin (LIPITOR) 40 MG tablet Take 40 mg by mouth at bedtime.   citalopram (CELEXA) 40 MG tablet SMARTSIG:1 Tablet(s) By Mouth Every Evening   fluticasone (FLONASE) 50 MCG/ACT nasal spray Place into the nose.   JARDIANCE 25 MG TABS tablet Take 25 mg by mouth daily.   linaclotide  (LINZESS ) 290 MCG CAPS capsule Take 1 capsule (290 mcg total) by mouth daily before breakfast.   omeprazole  (PRILOSEC) 40 MG capsule Take 1 capsule (40 mg total) by mouth daily.   ondansetron  (ZOFRAN -ODT) 4 MG disintegrating tablet    oxybutynin (DITROPAN-XL) 10 MG 24 hr tablet Take 10 mg by mouth daily.   TRUEPLUS PEN NEEDLES 31G X 8 MM MISC See admin instructions.   [DISCONTINUED] D 1000 25 MCG (1000 UT) capsule Take 1,000 Units by mouth daily.   [DISCONTINUED] famotidine  (PEPCID ) 20 MG tablet Take 1 tablet (20 mg total) by mouth 2 (two) times daily.   [DISCONTINUED] GABAPENTIN PO Take by mouth.   [DISCONTINUED] hydrOXYzine  (ATARAX ) 10 MG tablet Take 1  tablet (10 mg total) by mouth 2 (two) times daily as needed for anxiety.   [DISCONTINUED] insulin detemir (LEVEMIR) 100 UNIT/ML injection Inject into the skin.   [DISCONTINUED] Liraglutide (VICTOZA [place]) Inject into the skin.   [DISCONTINUED] LISINOPRIL PO Take by mouth.   No facility-administered encounter medications on file as of 12/14/2023.    Surgical History: Past Surgical History:  Procedure Laterality Date   ABDOMINAL  HYSTERECTOMY     COLONOSCOPY WITH PROPOFOL  N/A 10/30/2017   Procedure: COLONOSCOPY WITH PROPOFOL ;  Surgeon: Luke Salaam, MD;  Location: [facility] ENDOSCOPY;  Service: Gastroenterology;  Laterality: N/A;   COLONOSCOPY WITH PROPOFOL  N/A 02/04/2023   Procedure: COLONOSCOPY WITH PROPOFOL ;  Surgeon: Luke Salaam, MD;  Location: [facility] ENDOSCOPY;  Service: Gastroenterology;  Laterality: N/A;   ESOPHAGOGASTRODUODENOSCOPY (EGD) WITH PROPOFOL  N/A 10/30/2017   Procedure: ESOPHAGOGASTRODUODENOSCOPY (EGD) WITH PROPOFOL ;  Surgeon: Luke Salaam, MD;  Location: [facility] ENDOSCOPY;  Service: Gastroenterology;  Laterality: N/A;   ESOPHAGOGASTRODUODENOSCOPY (EGD) WITH PROPOFOL  N/A 02/04/2023   Procedure: ESOPHAGOGASTRODUODENOSCOPY (EGD) WITH PROPOFOL ;  Surgeon: Luke Salaam, MD;  Location: [facility] ENDOSCOPY;  Service: Gastroenterology;  Laterality: N/A;    Medical History: Past Medical History:  Diagnosis Date   Asthma    Diabetes mellitus without complication (HCC)    Hypertension    Neuropathy     Family History: Non contributory to the present illness  Social History: Social History   Socioeconomic History   Marital status: Single    Spouse name: Not on file   Number of children: Not on file   Years of education: Not on file   Highest education level: Not on file  Occupational History   Not on file  Tobacco Use   Smoking status: Never   Smokeless tobacco: Never  Vaping Use   Vaping status: Never Used  Substance and Sexual Activity   Alcohol use: Never   Drug use: Never   Sexual activity: Yes  Other Topics Concern   Not on file  Social History Narrative   Not on file   Social Drivers of Health   Financial Resource Strain: Not on file  Food Insecurity: Not on file  Transportation Needs: Not on file  Physical Activity: Not on file  Stress: Not on file  Social Connections: Not on file  Intimate Partner Violence: Not on file    Vital Signs: Blood pressure (!) 161/94, pulse 71, resp. rate 16,  height 5' 2.5" (1.588 m), weight 234 lb (106.1 kg), SpO2 96%. Body mass index is 42.12 kg/m.   Examination: General Appearance: The patient is well-developed, well-nourished, and in no distress. Neck Circumference: 42 cm Skin: Gross inspection of skin unremarkable. Head: normocephalic, no gross deformities. Eyes: no gross deformities noted. ENT: ears appear grossly normal Neurologic: Alert and oriented. No involuntary movements.    STOP BANG RISK ASSESSMENT S (snore) Have you been told that you snore?     YES   T (tired) Are you often tired, fatigued, or sleepy during the day?   YES  O (obstruction) Do you stop breathing, choke, or gasp during sleep? YES   P (pressure) Do you have or are you being treated for high blood pressure? YES   B (BMI) Is your body index greater than 35 kg/m? YES   A (age) Are you 3 years old or older? YES   N (neck) Do you have a neck circumference greater than 16 inches?   YES   G (gender) Are you a female? NO   TOTAL STOP/BANG "YES"  ANSWERS 7                                                               A STOP-Bang score of 2 or less is considered low risk, and a score of 5 or more is high risk for having either moderate or severe OSA. For people who score 3 or 4, doctors may need to perform further assessment to determine how likely they are to have OSA.         EPWORTH SLEEPINESS SCALE:  Scale:  (0)= no chance of dozing; (1)= slight chance of dozing; (2)= moderate chance of dozing; (3)= high chance of dozing  Chance  Situtation    Sitting and reading: 1    Watching TV: 1    Sitting Inactive in public: 0    As a passenger in car: 0      Lying down to rest: 1    Sitting and talking: 1    Sitting quielty after lunch: 2    In a car, stopped in traffic: 1   TOTAL SCORE:   7 out of 24    SLEEP STUDIES:  Not available   LABS: No results found for this or any previous visit (from the past 2160 hours).  Radiology: MM 3D  SCREENING MAMMOGRAM BILATERAL BREAST Result Date: 02/27/2023 CLINICAL DATA:  Screening. EXAM: DIGITAL SCREENING BILATERAL MAMMOGRAM WITH TOMOSYNTHESIS AND CAD TECHNIQUE: Bilateral screening digital craniocaudal and mediolateral oblique mammograms were obtained. Bilateral screening digital breast tomosynthesis was performed. The images were evaluated with computer-aided detection. COMPARISON:  Previous exam(s). ACR Breast Density Category b: There are scattered areas of fibroglandular density. FINDINGS: There are no findings suspicious for malignancy. IMPRESSION: No mammographic evidence of malignancy. A result letter of this screening mammogram will be mailed directly to the patient. RECOMMENDATION: Screening mammogram in one year. (Code:SM-B-01Y) BI-RADS CATEGORY  1: Negative. Electronically Signed   By: Allena Ito M.D.   On: 02/27/2023 15:10    No results found.  No results found.    Assessment and Plan: Patient Active Problem List   Diagnosis Date Noted   History of colon polyps 02/04/2023   Gastroesophageal reflux disease without esophagitis 02/04/2023   Background diabetic retinopathy associated with type 2 diabetes mellitus (HCC) 09/30/2018   Obesity, Class III, BMI 40-49.9 (morbid obesity) 09/15/2018   Type 2 diabetes mellitus with hyperglycemia, with long-term current use of insulin (HCC) 09/15/2018   Benign essential hypertension 04/03/2015   Asthma 09/20/2014   Chest pain, unspecified 09/20/2014   Depression with anxiety 09/20/2014   Diabetes (HCC) 09/20/2014   Diabetic neuropathy (HCC) 09/20/2014   Generalized anxiety disorder 09/20/2014   Mixed hyperlipidemia 09/20/2014   SOB (shortness of breath) 09/20/2014   1. OSA (obstructive sleep apnea) (Primary) PLAN OSA:   Patient evaluation suggests high risk of sleep disordered breathing due to history of OSA, obesity, snoring, frequent awakening, drowsy driving, daytime sleepiness.  Patient has comorbid cardiovascular risk  factors including: hypertension which could be exacerbated by pathologic sleep-disordered breathing.  Suggest: PSG to assess/treat the patient's sleep disordered breathing. The patient was also counselled on weight loss to optimize sleep health.   2. Hypersomnia Await psg for evaluation. .  3. Benign essential hypertension Controlled with lisinopril, continue.     General  Counseling: I have discussed the findings of the evaluation and examination with Felipa Horsfall.  I have also discussed any further diagnostic evaluation thatmay be needed or ordered today. Dashonna verbalizes understanding of the findings of todays visit. We also reviewed her medications today and discussed drug interactions and side effects including but not limited excessive drowsiness and altered mental states. We also discussed that there is always a risk not just to her but also people around her. she has been encouraged to call the office with any questions or concerns that should arise related to todays visit.  No orders of the defined types were placed in this encounter.       I have personally obtained a history, evaluated the patient, evaluated pertinent data, formulated the assessment and plan and placed orders.   This patient was seen today by Louann Rous, PA-C in collaboration with Dr. Cam Cava.   Cordie Deters, MD [facility] Diplomate ABMS Pulmonary and Critical Care Medicine Sleep medicine

## 2023-12-14 ENCOUNTER — Ambulatory Visit (INDEPENDENT_AMBULATORY_CARE_PROVIDER_SITE_OTHER): Admitting: Internal Medicine

## 2023-12-14 VITALS — BP 161/94 | HR 71 | Resp 16 | Ht 62.5 in | Wt 234.0 lb

## 2023-12-14 DIAGNOSIS — I10 Essential (primary) hypertension: Secondary | ICD-10-CM | POA: Diagnosis not present

## 2023-12-14 DIAGNOSIS — G4710 Hypersomnia, unspecified: Secondary | ICD-10-CM | POA: Insufficient documentation

## 2023-12-14 DIAGNOSIS — G4733 Obstructive sleep apnea (adult) (pediatric): Secondary | ICD-10-CM | POA: Diagnosis not present

## 2024-02-24 ENCOUNTER — Other Ambulatory Visit: Payer: Self-pay | Admitting: Internal Medicine

## 2024-02-24 DIAGNOSIS — R0789 Other chest pain: Secondary | ICD-10-CM

## 2024-02-24 DIAGNOSIS — R0602 Shortness of breath: Secondary | ICD-10-CM

## 2024-02-26 ENCOUNTER — Telehealth (HOSPITAL_COMMUNITY): Payer: Self-pay | Admitting: *Deleted

## 2024-02-26 NOTE — Telephone Encounter (Signed)
 Reaching out to patient to offer assistance regarding upcoming cardiac imaging study; pt verbalizes understanding of appt date/time, parking situation and where to check in, pre-test NPO status and medications ordered, and verified current allergies; name and call back number provided for further questions should they arise Sid Seats RN Navigator Cardiac Imaging [facility] [phone] office [phone] cell

## 2024-02-29 ENCOUNTER — Ambulatory Visit
Admission: RE | Admit: 2024-02-29 | Discharge: 2024-02-29 | Disposition: A | Discharge status: Home / Self Care | Source: Ambulatory Visit | Attending: Internal Medicine | Admitting: Internal Medicine

## 2024-02-29 DIAGNOSIS — R0789 Other chest pain: Secondary | ICD-10-CM | POA: Insufficient documentation

## 2024-02-29 DIAGNOSIS — R0602 Shortness of breath: Secondary | ICD-10-CM | POA: Diagnosis present

## 2024-02-29 MED ORDER — IOHEXOL 350 MG/ML SOLN
100.0000 mL | Freq: Once | INTRAVENOUS | Status: AC | PRN
  Administered 2024-02-29: 100 mL via INTRAVENOUS

## 2024-02-29 MED ORDER — NITROGLYCERIN 0.4 MG SL SUBL
0.8000 mg | SUBLINGUAL_TABLET | Freq: Once | SUBLINGUAL | Status: AC
  Administered 2024-02-29: 0.8 mg via SUBLINGUAL
  Filled 2024-02-29: qty 25

## 2024-02-29 MED ORDER — NITROGLYCERIN 0.4 MG SL SUBL
SUBLINGUAL_TABLET | SUBLINGUAL | Status: AC
Start: 2024-02-29 — End: 2024-02-29
  Filled 2024-02-29: qty 2

## 2024-06-17 NOTE — Progress Notes (Deleted)
 [facility] [address] [place], [place] [address]  Pulmonary Sleep Medicine   Office Visit Note  Patient Name: Kristen Ibarra DOB: Dec 27, 1965 MRN [id number]    Chief Complaint: Obstructive Sleep Apnea visit  Brief History:  Kristen Ibarra is seen today for a follow up visit for CPAP@ 6 cmH2O. The patient has a longstanding history of sleep apnea. Patient is using PAP nightly.  The patient feels rested after sleeping with PAP.  The patient reports benefiting from PAP use. Reported sleepiness is  improved and the Epworth Sleepiness Score is *** out of 24. The patient *** take naps. The patient complains of the following: ***  The compliance download shows 29% compliance with an average use time of 5 hours 10 minutes. The AHI is 0.6.  The patient *** of limb movements disrupting sleep. The patient continues to require PAP therapy in order to eliminate sleep apnea.  ROS  General: (-) fever, (-) chills, (-) night sweat Nose and Sinuses: (-) nasal stuffiness or itchiness, (-) postnasal drip, (-) nosebleeds, (-) sinus trouble. Mouth and Throat: (-) sore throat, (-) hoarseness. Neck: (-) swollen glands, (-) enlarged thyroid , (-) neck pain. Respiratory: *** cough, *** shortness of breath, *** wheezing. Neurologic: *** numbness, *** tingling. Psychiatric: *** anxiety, *** depression   Current Medication: Outpatient Encounter Medications as of 06/20/2024  Medication Sig   albuterol  (VENTOLIN  HFA) 108 (90 Base) MCG/ACT inhaler Inhale 2 puffs into the lungs every 6 (six) hours as needed for wheezing or shortness of breath.   atorvastatin (LIPITOR) 40 MG tablet Take 40 mg by mouth at bedtime.   cholecalciferol (VITAMIN D3) 25 MCG (1000 UNIT) tablet Take 1,000 Units by mouth daily.   citalopram (CELEXA) 40 MG tablet SMARTSIG:1 Tablet(s) By Mouth Every Evening   fluticasone (FLONASE) 50 MCG/ACT nasal spray Place into the nose.   gabapentin (NEURONTIN) 300 MG capsule Take 300 mg by  mouth 3 (three) times daily.   ibuprofen  (ADVIL ) 800 MG tablet Take 800 mg by mouth 3 (three) times daily.   JARDIANCE 25 MG TABS tablet Take 25 mg by mouth daily.   LANTUS SOLOSTAR 100 UNIT/ML Solostar Pen Inject into the skin.   linaclotide  (LINZESS ) 290 MCG CAPS capsule Take 1 capsule (290 mcg total) by mouth daily before breakfast.   lisinopril (ZESTRIL) 5 MG tablet Take 5 mg by mouth daily.   omeprazole  (PRILOSEC) 40 MG capsule Take 1 capsule (40 mg total) by mouth daily.   ondansetron  (ZOFRAN -ODT) 4 MG disintegrating tablet    oxybutynin (DITROPAN-XL) 10 MG 24 hr tablet Take 10 mg by mouth daily.   tiZANidine (ZANAFLEX) 4 MG tablet Take 4 mg by mouth at bedtime.   TRUEPLUS PEN NEEDLES 31G X 8 MM MISC See admin instructions.   No facility-administered encounter medications on file as of 06/20/2024.    Surgical History: Past Surgical History:  Procedure Laterality Date   ABDOMINAL HYSTERECTOMY     COLONOSCOPY WITH PROPOFOL  N/A 10/30/2017   Procedure: COLONOSCOPY WITH PROPOFOL ;  Surgeon: Therisa Bi, MD;  Location[facility] ENDOSCOPY;  Service: Gastroenterology;  Laterality: N/A;   COLONOSCOPY WITH PROPOFOL  N/A 02/04/2023   Procedure: COLONOSCOPY WITH PROPOFOL ;  Surgeon: Therisa Bi, MD;  Location: [facility] ENDOSCOPY;  Service: Gastroenterology;  Laterality: N/A;   ESOPHAGOGASTRODUODENOSCOPY (EGD) WITH PROPOFOL  N/A 10/30/2017   Procedure: ESOPHAGOGASTRODUODENOSCOPY (EGD) WITH PROPOFOL ;  Surgeon: Therisa Bi, MD;  Location: [facility] ENDOSCOPY;  Service: Gastroenterology;  Laterality: N/A;   ESOPHAGOGASTRODUODENOSCOPY (EGD) WITH PROPOFOL  N/A 02/04/2023   Procedure: ESOPHAGOGASTRODUODENOSCOPY (  EGD) WITH PROPOFOL ;  Surgeon: Therisa Bi, MD;  Location: [facility] ENDOSCOPY;  Service: Gastroenterology;  Laterality: N/A;    Medical History: Past Medical History:  Diagnosis Date   Asthma    Diabetes mellitus without complication (HCC)    Hypertension    Neuropathy     Family History: Non contributory to  the present illness  Social History: Social History   Socioeconomic History   Marital status: Single    Spouse name: Not on file   Number of children: Not on file   Years of education: Not on file   Highest education level: Not on file  Occupational History   Not on file  Tobacco Use   Smoking status: Never   Smokeless tobacco: Never  Vaping Use   Vaping status: Never Used  Substance and Sexual Activity   Alcohol use: Never   Drug use: Never   Sexual activity: Yes  Other Topics Concern   Not on file  Social History Narrative   Not on file   Social Drivers of Health   Financial Resource Strain: Not on file  Food Insecurity: Not on file  Transportation Needs: Not on file  Physical Activity: Not on file  Stress: Not on file  Social Connections: Not on file  Intimate Partner Violence: Not on file    Vital Signs: There were no vitals taken for this visit. There is no height or weight on file to calculate BMI.    Examination: General Appearance: The patient is well-developed, well-nourished, and in no distress. Neck Circumference: *** Skin: Gross inspection of skin unremarkable. Head: normocephalic, no gross deformities. Eyes: no gross deformities noted. ENT: ears appear grossly normal Neurologic: Alert and oriented. No involuntary movements.  STOP BANG RISK ASSESSMENT S (snore) Have you been told that you snore?     YES/N   T (tired) Are you often tired, fatigued, or sleepy during the day?   YES/NO  O (obstruction) Do you stop breathing, choke, or gasp during sleep? YES/NO   P (pressure) Do you have or are you being treated for high blood pressure? YES/NO   B (BMI) Is your body index greater than 35 kg/m? YES/NO   A (age) Are you 57 years old or older? YES   N (neck) Do you have a neck circumference greater than 16 inches?   YES/NO   G (gender) Are you a female? NO   TOTAL STOP/BANG "YES" ANSWERS        A STOP-Bang score of 2 or less is considered low  risk, and a score of 5 or more is high risk for having either moderate or severe OSA. For people who score 3 or 4, doctors may need to perform further assessment to determine how likely they are to have OSA.         EPWORTH SLEEPINESS SCALE:  Scale:  (0)= no chance of dozing; (1)= slight chance of dozing; (2)= moderate chance of dozing; (3)= high chance of dozing  Chance  Situtation    Sitting and reading: ***    Watching TV: ***    Sitting Inactive in public: ***    As a passenger in car: ***      Lying down to rest: ***    Sitting and talking: ***    Sitting quielty after lunch: ***    In a car, stopped in traffic: ***   TOTAL SCORE:   *** out of 24    SLEEP STUDIES:  PSG (02/2024) AHI 8/hr,  REM AHI 28.5/hr, Supine AHI 72/hr, min SpO2 76% Titration (03/2024) CPAP@ 6 cmH2O   CPAP COMPLIANCE DATA:  Date Range: 05/11/2024-06/14/2024  Average Daily Use: 5 hours 10 minutes  Median Use: 4 hours 59 minutes  Compliance for > 4 Hours: 29%  AHI: 0.6 respiratory events per hour  Days Used: 13/35 days  Mask Leak: 1.4  95th Percentile Pressure: 6         LABS: No results found for this or any previous visit (from the past 2160 hours).  Radiology: CT CORONARY MORPH W/CTA COR W/SCORE W/CA W/CM &/OR WO/CM Addendum Date: 03/01/2024 ADDENDUM REPORT: 03/01/2024 20:09 EXAM: OVER-READ INTERPRETATION  CT CHEST The following report is an over-read performed by radiologist Dr. Fonda Mom [facility] on 03/01/2024. This over-read does not include interpretation of cardiac or coronary anatomy or pathology. The coronary CTA interpretation by the cardiologist is attached. COMPARISON:  None. FINDINGS: Cardiovascular:  See findings discussed in the body of the report. Mediastinum/Nodes: No suspicious adenopathy identified. Imaged mediastinal structures are unremarkable. Lungs/Pleura: Imaged lungs are clear. No pleural effusion or pneumothorax. Upper Abdomen:  No acute abnormality. Musculoskeletal: No chest wall abnormality. No acute osseous findings. IMPRESSION: No acute extracardiac incidental findings. Electronically Signed   By: Fonda Field M.D.   On: 03/01/2024 20:09   Result Date: 03/01/2024 : CLINICAL DATA:  Chest pain EXAM: Cardiac/Coronary  CTA TECHNIQUE: The patient was scanned on a Siemens Somatom scanner. A prospective scan was triggered in the ascending thoracic aorta. Axial non-contrast 3 mm slices were carried out through the heart. The data set was analyzed on a dedicated work station and scored using the Agatston method. Gantry rotation speed was 66 msecs and collimation was.6 mm. 0.8 Mg of sl NTG was given. The 3D data set was reconstructed in 5% intervals of the 60-95 % of the R-R cycle. Diastolic phases were analyzed on a dedicated work station using MPR, MIP and VRT modes. The patient received 100 cc of contrast. FINDINGS: Aorta: Normal size.  No calcifications.  No dissection. Aortic Valve: Trileaflet.  No calcifications. Coronary Arteries: Normal coronary origin.  Right dominance. RCA is a dominant artery. There is no plaque. Left main gives rise to LAD and LCX arteries. LM has no disease. LAD has no plaque. LCX is a non-dominant artery.  There is no plaque. Other findings: Pericardium: Normal. Normal pulmonary vein drainage into the left atrium. Normal left atrial appendage without a thrombus. Normal size of the pulmonary artery. IMPRESSION: 1. Coronary calcium score of 0. 2. Normal coronary origin with right dominance. 3. No evidence of CAD. 4. CAD-RADS 0. Consider non-atherosclerotic causes of chest pain. 5. See separate report from [facility] radiology for non-cardiac findings. Electronically Signed: By: Keller Paterson M.D. On: 02/29/2024 18:44    No results found.  No results found.    Assessment and Plan: Patient Active Problem List   Diagnosis Date Noted   OSA (obstructive sleep apnea) 12/14/2023   Hypersomnia 12/14/2023    History of colon polyps 02/04/2023   Gastroesophageal reflux disease without esophagitis 02/04/2023   Background diabetic retinopathy associated with type 2 diabetes mellitus (HCC) 09/30/2018   Obesity, Class III, BMI 40-49.9 (morbid obesity) (HCC) 09/15/2018   Type 2 diabetes mellitus with hyperglycemia, with long-term current use of insulin (HCC) 09/15/2018   Benign essential hypertension 04/03/2015   Asthma 09/20/2014   Chest pain, unspecified 09/20/2014   Depression with anxiety 09/20/2014   Diabetes (HCC) 09/20/2014   Diabetic neuropathy (HCC) 09/20/2014  Generalized anxiety disorder 09/20/2014   Mixed hyperlipidemia 09/20/2014   SOB (shortness of breath) 09/20/2014      The patient *** tolerate PAP and reports *** benefit from PAP use. The patient was reminded how to *** and advised to ***. The patient was also counselled on ***. The compliance is ***. The AHI is ***.   ***  General Counseling: I have discussed the findings of the evaluation and examination with Kristen Ibarra.  I have also discussed any further diagnostic evaluation thatmay be needed or ordered today. Kristen Ibarra verbalizes understanding of the findings of todays visit. We also reviewed her medications today and discussed drug interactions and side effects including but not limited excessive drowsiness and altered mental states. We also discussed that there is always a risk not just to her but also people around her. she has been encouraged to call the office with any questions or concerns that should arise related to todays visit.  No orders of the defined types were placed in this encounter.       I have personally obtained a history, examined the patient, evaluated laboratory and imaging results, formulated the assessment and plan and placed orders.  Elfreda DELENA Bathe, MD [facility] Diplomate ABMS Pulmonary Critical Care Medicine and Sleep Medicine

## 2024-06-20 ENCOUNTER — Ambulatory Visit
